# Patient Record
Sex: Male | Born: 1970 | Race: White | Hispanic: No | Marital: Married | Smoking: Never smoker
Health system: Southern US, Community
[De-identification: ages and names within clinical notes are randomized; demographics above are authoritative.]

## PROBLEM LIST (undated history)

## (undated) DIAGNOSIS — K852 Alcohol induced acute pancreatitis without necrosis or infection: Secondary | ICD-10-CM

## (undated) DIAGNOSIS — S0990XA Unspecified injury of head, initial encounter: Secondary | ICD-10-CM

## (undated) DIAGNOSIS — F1010 Alcohol abuse, uncomplicated: Secondary | ICD-10-CM

## (undated) DIAGNOSIS — I10 Essential (primary) hypertension: Secondary | ICD-10-CM

## (undated) HISTORY — PX: TRACHEOSTOMY: SUR1362

## (undated) HISTORY — DX: Unspecified injury of head, initial encounter: S09.90XA

---

## 2008-08-30 ENCOUNTER — Emergency Department (HOSPITAL_BASED_OUTPATIENT_CLINIC_OR_DEPARTMENT_OTHER): Admission: EM | Admit: 2008-08-30 | Discharge: 2008-08-30 | Payer: Self-pay | Admitting: Emergency Medicine

## 2013-01-22 ENCOUNTER — Emergency Department (HOSPITAL_BASED_OUTPATIENT_CLINIC_OR_DEPARTMENT_OTHER): Payer: Self-pay

## 2013-01-22 ENCOUNTER — Encounter (HOSPITAL_BASED_OUTPATIENT_CLINIC_OR_DEPARTMENT_OTHER): Payer: Self-pay | Admitting: *Deleted

## 2013-01-22 ENCOUNTER — Emergency Department (HOSPITAL_BASED_OUTPATIENT_CLINIC_OR_DEPARTMENT_OTHER)
Admission: EM | Admit: 2013-01-22 | Discharge: 2013-01-22 | Disposition: A | Discharge status: Home / Self Care | Payer: Self-pay | Attending: Emergency Medicine | Admitting: Emergency Medicine

## 2013-01-22 DIAGNOSIS — R0789 Other chest pain: Secondary | ICD-10-CM | POA: Insufficient documentation

## 2013-01-22 DIAGNOSIS — R062 Wheezing: Secondary | ICD-10-CM | POA: Insufficient documentation

## 2013-01-22 DIAGNOSIS — J3489 Other specified disorders of nose and nasal sinuses: Secondary | ICD-10-CM | POA: Insufficient documentation

## 2013-01-22 DIAGNOSIS — R059 Cough, unspecified: Secondary | ICD-10-CM | POA: Insufficient documentation

## 2013-01-22 DIAGNOSIS — J40 Bronchitis, not specified as acute or chronic: Secondary | ICD-10-CM

## 2013-01-22 DIAGNOSIS — R509 Fever, unspecified: Secondary | ICD-10-CM | POA: Insufficient documentation

## 2013-01-22 DIAGNOSIS — R05 Cough: Secondary | ICD-10-CM | POA: Insufficient documentation

## 2013-01-22 DIAGNOSIS — J209 Acute bronchitis, unspecified: Secondary | ICD-10-CM | POA: Insufficient documentation

## 2013-01-22 MED ORDER — AZITHROMYCIN 250 MG PO TABS
500.0000 mg | ORAL_TABLET | Freq: Once | ORAL | Status: AC
  Administered 2013-01-22: 500 mg via ORAL
  Filled 2013-01-22: qty 2

## 2013-01-22 MED ORDER — PREDNISONE 20 MG PO TABS: 60.0000 mg | ORAL_TABLET | Freq: Every day | ORAL | Status: DC

## 2013-01-22 MED ORDER — ALBUTEROL SULFATE HFA 108 (90 BASE) MCG/ACT IN AERS
2.0000 | INHALATION_SPRAY | RESPIRATORY_TRACT | Status: DC | PRN
  Administered 2013-01-22: 2 via RESPIRATORY_TRACT
  Filled 2013-01-22: qty 6.7

## 2013-01-22 MED ORDER — PREDNISONE 50 MG PO TABS
60.0000 mg | ORAL_TABLET | Freq: Once | ORAL | Status: AC
  Administered 2013-01-22: 60 mg via ORAL
  Filled 2013-01-22: qty 1

## 2013-01-22 MED ORDER — AZITHROMYCIN 250 MG PO TABS: 250.0000 mg | ORAL_TABLET | Freq: Every day | ORAL | Status: DC

## 2013-01-22 NOTE — ED Provider Notes (Signed)
History  This chart was scribed for Tyler Warren,  by Ardeen Jourdain, ED Scribe. This patient was seen in room MH06/MH06 and the patient's care was started at 2254.  CSN: [id number]  Arrival date & time 01/22/13  2107   First MD Initiated Contact with Patient 01/22/13 2254      Chief Complaint  Patient presents with  . URI     The history is provided by the patient. No language interpreter was used.    Tyler Warren is a 42 y.o. male who presents to the Emergency Department complaining of gradual onset, gradually worsening, constant cough with associated fever, congestion and chest tightness that began 4 days ago. He states he has a productive cough. He denies any nausea, emesis, diarrhea, rash, ear pain and CP as associated symptoms. He denies any previous similar symptoms. He denies any h/o asthma or other respiratory disorders.   History reviewed. No pertinent past medical history.  Past Surgical History  Procedure Laterality Date  . Tracheostomy      History reviewed. No pertinent family history.  History  Substance Use Topics  . Smoking status: Never Smoker   . Smokeless tobacco: Not on file  . Alcohol Use: No      Review of Systems  Constitutional: Negative for fever and chills.  HENT: Positive for congestion, rhinorrhea and sinus pressure. Negative for ear pain, sore throat and neck pain.   Respiratory: Positive for cough and chest tightness. Negative for shortness of breath.   Cardiovascular: Negative for chest pain.  Gastrointestinal: Negative for nausea, vomiting and diarrhea.  Neurological: Negative for weakness.  All other systems reviewed and are negative.    Allergies  Review of patient's allergies indicates no known allergies.  Home Medications  No current outpatient prescriptions on file.  Triage Vitals: BP 162/90  Pulse 103  Temp(Src) 100.4 F (38 C) (Oral)  Resp 16  Ht 5\' 9"  (1.753 m)  Wt 200 lb (90.719 kg)  BMI 29.52 kg/m2  SpO2  100%  Physical Exam  Nursing note and vitals reviewed. Constitutional: He is oriented to person, place, and time. He appears well-developed and well-nourished. No distress.  HENT:  Head: Normocephalic and atraumatic.  Eyes: EOM are normal. Pupils are equal, round, and reactive to light.  Neck: Normal range of motion. Neck supple. No tracheal deviation present.  Cardiovascular: Normal rate, regular rhythm and normal heart sounds.   Pulmonary/Chest: Effort normal. No respiratory distress. He has wheezes. He has no rales. He exhibits no tenderness.  Inspiratory and expiratory wheezes bilaterally with diminished breath sounds  Abdominal: Soft. Bowel sounds are normal. He exhibits no distension and no mass. There is no tenderness. There is no rebound and no guarding.  Musculoskeletal: Normal range of motion. He exhibits no edema.  Neurological: He is alert and oriented to person, place, and time.  Skin: Skin is warm and dry. He is not diaphoretic.  Psychiatric: He has a normal mood and affect. His behavior is normal.    ED Course  Procedures (including critical care time)  DIAGNOSTIC STUDIES: Oxygen Saturation is 100% on room air, normal by my interpretation.    COORDINATION OF CARE:  10:59 PM-Discussed treatment plan which includes CXR and cough medication with pt at bedside and pt agreed to plan.    Labs Reviewed - No data to display Dg Chest 2 View  01/22/2013  *RADIOLOGY REPORT*  Clinical Data: Cough and congestion.  CHEST - 2 VIEW  Comparison: None available.  Findings:  The heart size is normal.  Mild interstitial coarsening is likely chronic.  No significant airspace consolidation is evident.  Degenerative or post-traumatic changes are again noted at the right [facility] joint and coracoclavicular ligament.  The visualized soft tissues and bony thorax are otherwise unremarkable.  IMPRESSION:  1.  No acute cardiopulmonary disease. 2.  Low lung volumes and mild chronic interstitial coarsening.  3.  Post-traumatic changes of the distal right clavicle.   Original Report Authenticated By: Marin Roberts, M.D.      Diagnosis.: Bronchitis    MDM  Patient presents with several days of cough and fever. He has respiratory and expiratory wheezing but normal oxygenation. Chest x-ray shows no evidence of pneumonia. Patient will be treated empirically with Zithromax, prednisone and bronchodilators.  I personally performed the services described in this documentation, which was scribed in my presence. The recorded information has been reviewed and is accurate.     Tyler Crease, MD 01/22/13 [phone]

## 2013-01-22 NOTE — Patient Instructions (Signed)
Instructed pt on the proper use of administering albuteral mdi via aerochamber pt tolerated well

## 2013-01-22 NOTE — ED Notes (Signed)
Pt c/o URi symptoms x 4 days 

## 2014-06-17 ENCOUNTER — Ambulatory Visit (INDEPENDENT_AMBULATORY_CARE_PROVIDER_SITE_OTHER): Payer: Managed Care, Other (non HMO) | Admitting: Family Medicine

## 2014-06-17 ENCOUNTER — Encounter: Payer: Self-pay | Admitting: Family Medicine

## 2014-06-17 VITALS — BP 153/96 | HR 73 | Ht 69.0 in | Wt 202.0 lb

## 2014-06-17 DIAGNOSIS — R209 Unspecified disturbances of skin sensation: Secondary | ICD-10-CM

## 2014-06-17 DIAGNOSIS — I10 Essential (primary) hypertension: Secondary | ICD-10-CM

## 2014-06-17 DIAGNOSIS — Z87820 Personal history of traumatic brain injury: Secondary | ICD-10-CM | POA: Insufficient documentation

## 2014-06-17 DIAGNOSIS — R200 Anesthesia of skin: Secondary | ICD-10-CM | POA: Insufficient documentation

## 2014-06-17 MED ORDER — LISINOPRIL 20 MG PO TABS: ORAL_TABLET | ORAL | Status: DC
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
## 2014-06-17 NOTE — Patient Instructions (Signed)
Blood pressure log: Goal less than 140/90

## 2014-06-18 ENCOUNTER — Telehealth: Payer: Self-pay

## 2014-06-18 NOTE — Telephone Encounter (Signed)
Patient wanted his pharmacy changed to [facility]. Done.

## 2014-07-05 ENCOUNTER — Encounter: Payer: Self-pay | Admitting: Family Medicine

## 2014-07-05 ENCOUNTER — Ambulatory Visit (INDEPENDENT_AMBULATORY_CARE_PROVIDER_SITE_OTHER): Payer: Managed Care, Other (non HMO) | Admitting: Family Medicine

## 2014-07-05 VITALS — BP 126/79 | HR 60 | Temp 98.2°F | Wt 197.0 lb

## 2014-07-05 DIAGNOSIS — J329 Chronic sinusitis, unspecified: Secondary | ICD-10-CM

## 2014-07-05 DIAGNOSIS — I10 Essential (primary) hypertension: Secondary | ICD-10-CM

## 2014-07-05 DIAGNOSIS — A499 Bacterial infection, unspecified: Secondary | ICD-10-CM

## 2014-07-05 DIAGNOSIS — K852 Alcohol induced acute pancreatitis without necrosis or infection: Secondary | ICD-10-CM

## 2014-07-05 DIAGNOSIS — K859 Acute pancreatitis without necrosis or infection, unspecified: Secondary | ICD-10-CM | POA: Insufficient documentation

## 2014-07-05 DIAGNOSIS — B9689 Other specified bacterial agents as the cause of diseases classified elsewhere: Secondary | ICD-10-CM

## 2014-07-05 MED ORDER — LISINOPRIL 20 MG PO TABS: ORAL_TABLET | ORAL | Status: DC

## 2014-07-05 MED ORDER — AMOXICILLIN-POT CLAVULANATE 500-125 MG PO TABS: ORAL_TABLET | ORAL | Status: AC
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
## 2014-12-16 ENCOUNTER — Ambulatory Visit (INDEPENDENT_AMBULATORY_CARE_PROVIDER_SITE_OTHER): Payer: Managed Care, Other (non HMO) | Admitting: Physician Assistant

## 2014-12-16 ENCOUNTER — Encounter: Payer: Self-pay | Admitting: Physician Assistant

## 2014-12-16 VITALS — BP 135/79 | HR 75 | Ht 69.0 in | Wt 203.0 lb

## 2014-12-16 DIAGNOSIS — M545 Low back pain, unspecified: Secondary | ICD-10-CM

## 2014-12-16 MED ORDER — HYDROCODONE-ACETAMINOPHEN 7.5-325 MG PO TABS: 1.0000 | ORAL_TABLET | Freq: Three times a day (TID) | ORAL | Status: DC | PRN

## 2014-12-16 MED ORDER — MELOXICAM 15 MG PO TABS: 15.0000 mg | ORAL_TABLET | Freq: Every day | ORAL | Status: DC

## 2014-12-16 MED ORDER — KETOROLAC TROMETHAMINE 60 MG/2ML IM SOLN
60.0000 mg | Freq: Once | INTRAMUSCULAR | Status: AC
  Administered 2014-12-16: 60 mg via INTRAMUSCULAR

## 2014-12-16 MED ORDER — CYCLOBENZAPRINE HCL 10 MG PO TABS: 10.0000 mg | ORAL_TABLET | Freq: Three times a day (TID) | ORAL | Status: DC | PRN
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
## 2014-12-16 NOTE — Patient Instructions (Signed)
mobic with food for next 2 weeks.  Heat  norco as needed for acute pain.  Flexeril as muscle relaxer(may make sleepy).  Low Back Sprain with Rehab  A sprain is an injury in which a ligament is torn. The ligaments of the lower back are vulnerable to sprains. However, they are strong and require great force to be injured. These ligaments are important for stabilizing the spinal column. Sprains are classified into three categories. Grade 1 sprains cause pain, but the tendon is not lengthened. Grade 2 sprains include a lengthened ligament, due to the ligament being stretched or partially ruptured. With grade 2 sprains there is still function, although the function may be decreased. Grade 3 sprains involve a complete tear of the tendon or muscle, and function is usually impaired. SYMPTOMS   Severe pain in the lower back.  Sometimes, a feeling of a "pop," "snap," or tear, at the time of injury.  Tenderness and sometimes swelling at the injury site.  Uncommonly, bruising (contusion) within 48 hours of injury.  Muscle spasms in the back. CAUSES  Low back sprains occur when a force is placed on the ligaments that is greater than they can handle. Common causes of injury include:  Performing a stressful act while off-balance.  Repetitive stressful activities that involve movement of the lower back.  Direct hit (trauma) to the lower back. RISK INCREASES WITH:  Contact sports (football, wrestling).  Collisions (major skiing accidents).  Sports that require throwing or lifting (baseball, weightlifting).  Sports involving twisting of the spine (gymnastics, diving, tennis, golf).  Poor strength and flexibility.  Inadequate protection.  Previous back injury or surgery (especially fusion). PREVENTION  Wear properly fitted and padded protective equipment.  Warm up and stretch properly before activity.  Allow for adequate recovery between workouts.  Maintain physical  fitness:  Strength, flexibility, and endurance.  Cardiovascular fitness.  Maintain a healthy body weight. PROGNOSIS  If treated properly, low back sprains usually heal with non-surgical treatment. The length of time for healing depends on the severity of the injury.  RELATED COMPLICATIONS   Recurring symptoms, resulting in a chronic problem.  Chronic inflammation and pain in the low back.  Delayed healing or resolution of symptoms, especially if activity is resumed too soon.  Prolonged impairment.  Unstable or arthritic joints of the low back. TREATMENT  Treatment first involves the use of ice and medicine, to reduce pain and inflammation. The use of strengthening and stretching exercises may help reduce pain with activity. These exercises may be performed at home or with a therapist. Severe injuries may require referral to a therapist for further evaluation and treatment, such as ultrasound. Your caregiver may advise that you wear a back brace or corset, to help reduce pain and discomfort. Often, prolonged bed rest results in greater harm then benefit. Corticosteroid injections may be recommended. However, these should be reserved for the most serious cases. It is important to avoid using your back when lifting objects. At night, sleep on your back on a firm mattress, with a pillow placed under your knees. If non-surgical treatment is unsuccessful, surgery may be needed.  MEDICATION   If pain medicine is needed, nonsteroidal anti-inflammatory medicines (aspirin and ibuprofen), or other minor pain relievers (acetaminophen), are often advised.  Do not take pain medicine for 7 days before surgery.  Prescription pain relievers may be given, if your caregiver thinks they are needed. Use only as directed and only as much as you need.  Ointments applied to  the skin may be helpful.  Corticosteroid injections may be given by your caregiver. These injections should be reserved for the most  serious cases, because they may only be given a certain number of times. HEAT AND COLD  Cold treatment (icing) should be applied for 10 to 15 minutes every 2 to 3 hours for inflammation and pain, and immediately after activity that aggravates your symptoms. Use ice packs or an ice massage.  Heat treatment may be used before performing stretching and strengthening activities prescribed by your caregiver, physical therapist, or athletic trainer. Use a heat pack or a warm water soak. SEEK MEDICAL CARE IF:   Symptoms get worse or do not improve in 2 to 4 weeks, despite treatment.  You develop numbness or weakness in either leg.  You lose bowel or bladder function.  Any of the following occur after surgery: fever, increased pain, swelling, redness, drainage of fluids, or bleeding in the affected area.  New, unexplained symptoms develop. (Drugs used in treatment may produce side effects.) EXERCISES  RANGE OF MOTION (ROM) AND STRETCHING EXERCISES - Low Back Sprain Most people with lower back pain will find that their symptoms get worse with excessive bending forward (flexion) or arching at the lower back (extension). The exercises that will help resolve your symptoms will focus on the opposite motion.  Your physician, physical therapist or athletic trainer will help you determine which exercises will be most helpful to resolve your lower back pain. Do not complete any exercises without first consulting with your caregiver. Discontinue any exercises which make your symptoms worse, until you speak to your caregiver. If you have pain, numbness or tingling which travels down into your buttocks, leg or foot, the goal of the therapy is for these symptoms to move closer to your back and eventually resolve. Sometimes, these leg symptoms will get better, but your lower back pain may worsen. This is often an indication of progress in your rehabilitation. Be very alert to any changes in your symptoms and the  activities in which you participated in the 24 hours prior to the change. Sharing this information with your caregiver will allow him or her to most efficiently treat your condition. These exercises may help you when beginning to rehabilitate your injury. Your symptoms may resolve with or without further involvement from your physician, physical therapist or athletic trainer. While completing these exercises, remember:   Restoring tissue flexibility helps normal motion to return to the joints. This allows healthier, less painful movement and activity.  An effective stretch should be held for at least 30 seconds.  A stretch should never be painful. You should only feel a gentle lengthening or release in the stretched tissue. FLEXION RANGE OF MOTION AND STRETCHING EXERCISES: STRETCH - Flexion, Single Knee to Chest   Lie on a firm bed or floor with both legs extended in front of you.  Keeping one leg in contact with the floor, bring your opposite knee to your chest. Hold your leg in place by either grabbing behind your thigh or at your knee.  Pull until you feel a gentle stretch in your low back. Hold __________ seconds.  Slowly release your grasp and repeat the exercise with the opposite side. Repeat __________ times. Complete this exercise __________ times per day.  STRETCH - Flexion, Double Knee to Chest  Lie on a firm bed or floor with both legs extended in front of you.  Keeping one leg in contact with the floor, bring your opposite knee  to your chest.  Tense your stomach muscles to support your back and then lift your other knee to your chest. Hold your legs in place by either grabbing behind your thighs or at your knees.  Pull both knees toward your chest until you feel a gentle stretch in your low back. Hold __________ seconds.  Tense your stomach muscles and slowly return one leg at a time to the floor. Repeat __________ times. Complete this exercise __________ times per day.   STRETCH - Low Trunk Rotation  Lie on a firm bed or floor. Keeping your legs in front of you, bend your knees so they are both pointed toward the ceiling and your feet are flat on the floor.  Extend your arms out to the side. This will stabilize your upper body by keeping your shoulders in contact with the floor.  Gently and slowly drop both knees together to one side until you feel a gentle stretch in your low back. Hold for __________ seconds.  Tense your stomach muscles to support your lower back as you bring your knees back to the starting position. Repeat the exercise to the other side. Repeat __________ times. Complete this exercise __________ times per day  EXTENSION RANGE OF MOTION AND FLEXIBILITY EXERCISES: STRETCH - Extension, Prone on Elbows   Lie on your stomach on the floor, a bed will be too soft. Place your palms about shoulder width apart and at the height of your head.  Place your elbows under your shoulders. If this is too painful, stack pillows under your chest.  Allow your body to relax so that your hips drop lower and make contact more completely with the floor.  Hold this position for __________ seconds.  Slowly return to lying flat on the floor. Repeat __________ times. Complete this exercise __________ times per day.  RANGE OF MOTION - Extension, Prone Press Ups  Lie on your stomach on the floor, a bed will be too soft. Place your palms about shoulder width apart and at the height of your head.  Keeping your back as relaxed as possible, slowly straighten your elbows while keeping your hips on the floor. You may adjust the placement of your hands to maximize your comfort. As you gain motion, your hands will come more underneath your shoulders.  Hold this position __________ seconds.  Slowly return to lying flat on the floor. Repeat __________ times. Complete this exercise __________ times per day.  RANGE OF MOTION- Quadruped, Neutral Spine   Assume a hands  and knees position on a firm surface. Keep your hands under your shoulders and your knees under your hips. You may place padding under your knees for comfort.  Drop your head and point your tailbone toward the ground below you. This will round out your lower back like an angry cat. Hold this position for __________ seconds.  Slowly lift your head and release your tail bone so that your back sags into a large arch, like an old horse.  Hold this position for __________ seconds.  Repeat this until you feel limber in your low back.  Now, find your "sweet spot." This will be the most comfortable position somewhere between the two previous positions. This is your neutral spine. Once you have found this position, tense your stomach muscles to support your low back.  Hold this position for __________ seconds. Repeat __________ times. Complete this exercise __________ times per day.  STRENGTHENING EXERCISES - Low Back Sprain These exercises may help you when  beginning to rehabilitate your injury. These exercises should be done near your "sweet spot." This is the neutral, low-back arch, somewhere between fully rounded and fully arched, that is your least painful position. When performed in this safe range of motion, these exercises can be used for people who have either a flexion or extension based injury. These exercises may resolve your symptoms with or without further involvement from your physician, physical therapist or athletic trainer. While completing these exercises, remember:   Muscles can gain both the endurance and the strength needed for everyday activities through controlled exercises.  Complete these exercises as instructed by your physician, physical therapist or athletic trainer. Increase the resistance and repetitions only as guided.  You may experience muscle soreness or fatigue, but the pain or discomfort you are trying to eliminate should never worsen during these exercises. If this  pain does worsen, stop and make certain you are following the directions exactly. If the pain is still present after adjustments, discontinue the exercise until you can discuss the trouble with your caregiver. STRENGTHENING - Deep Abdominals, Pelvic Tilt   Lie on a firm bed or floor. Keeping your legs in front of you, bend your knees so they are both pointed toward the ceiling and your feet are flat on the floor.  Tense your lower abdominal muscles to press your low back into the floor. This motion will rotate your pelvis so that your tail bone is scooping upwards rather than pointing at your feet or into the floor. With a gentle tension and even breathing, hold this position for __________ seconds. Repeat __________ times. Complete this exercise __________ times per day.  STRENGTHENING - Abdominals, Crunches   Lie on a firm bed or floor. Keeping your legs in front of you, bend your knees so they are both pointed toward the ceiling and your feet are flat on the floor. Cross your arms over your chest.  Slightly tip your chin down without bending your neck.  Tense your abdominals and slowly lift your trunk high enough to just clear your shoulder blades. Lifting higher can put excessive stress on the lower back and does not further strengthen your abdominal muscles.  Control your return to the starting position. Repeat __________ times. Complete this exercise __________ times per day.  STRENGTHENING - Quadruped, Opposite UE/LE Lift   Assume a hands and knees position on a firm surface. Keep your hands under your shoulders and your knees under your hips. You may place padding under your knees for comfort.  Find your neutral spine and gently tense your abdominal muscles so that you can maintain this position. Your shoulders and hips should form a rectangle that is parallel with the floor and is not twisted.  Keeping your trunk steady, lift your right hand no higher than your shoulder and then your  left leg no higher than your hip. Make sure you are not holding your breath. Hold this position for __________ seconds.  Continuing to keep your abdominal muscles tense and your back steady, slowly return to your starting position. Repeat with the opposite arm and leg. Repeat __________ times. Complete this exercise __________ times per day.  STRENGTHENING - Abdominals and Quadriceps, Straight Leg Raise   Lie on a firm bed or floor with both legs extended in front of you.  Keeping one leg in contact with the floor, bend the other knee so that your foot can rest flat on the floor.  Find your neutral spine, and tense your abdominal  muscles to maintain your spinal position throughout the exercise.  Slowly lift your straight leg off the floor about 6 inches for a count of 15, making sure to not hold your breath.  Still keeping your neutral spine, slowly lower your leg all the way to the floor. Repeat this exercise with each leg __________ times. Complete this exercise __________ times per day. POSTURE AND BODY MECHANICS CONSIDERATIONS - Low Back Sprain Keeping correct posture when sitting, standing or completing your activities will reduce the stress put on different body tissues, allowing injured tissues a chance to heal and limiting painful experiences. The following are general guidelines for improved posture. Your physician or physical therapist will provide you with any instructions specific to your needs. While reading these guidelines, remember:  The exercises prescribed by your provider will help you have the flexibility and strength to maintain correct postures.  The correct posture provides the best environment for your joints to work. All of your joints have less wear and tear when properly supported by a spine with good posture. This means you will experience a healthier, less painful body.  Correct posture must be practiced with all of your activities, especially prolonged sitting and  standing. Correct posture is as important when doing repetitive low-stress activities (typing) as it is when doing a single heavy-load activity (lifting). RESTING POSITIONS Consider which positions are most painful for you when choosing a resting position. If you have pain with flexion-based activities (sitting, bending, stooping, squatting), choose a position that allows you to rest in a less flexed posture. You would want to avoid curling into a fetal position on your side. If your pain worsens with extension-based activities (prolonged standing, working overhead), avoid resting in an extended position such as sleeping on your stomach. Most people will find more comfort when they rest with their spine in a more neutral position, neither too rounded nor too arched. Lying on a non-sagging bed on your side with a pillow between your knees, or on your back with a pillow under your knees will often provide some relief. Keep in mind, being in any one position for a prolonged period of time, no matter how correct your posture, can still lead to stiffness. PROPER SITTING POSTURE In order to minimize stress and discomfort on your spine, you must sit with correct posture. Sitting with good posture should be effortless for a healthy body. Returning to good posture is a gradual process. Many people can work toward this most comfortably by using various supports until they have the flexibility and strength to maintain this posture on their own. When sitting with proper posture, your ears will fall over your shoulders and your shoulders will fall over your hips. You should use the back of the chair to support your upper back. Your lower back will be in a neutral position, just slightly arched. You may place a small pillow or folded towel at the base of your lower back for  support.  When working at a desk, create an environment that supports good, upright posture. Without extra support, muscles tire, which leads to  excessive strain on joints and other tissues. Keep these recommendations in mind: CHAIR:  A chair should be able to slide under your desk when your back makes contact with the back of the chair. This allows you to work closely.  The chair's height should allow your eyes to be level with the upper part of your monitor and your hands to be slightly lower than your  elbows. BODY POSITION  Your feet should make contact with the floor. If this is not possible, use a foot rest.  Keep your ears over your shoulders. This will reduce stress on your neck and low back. INCORRECT SITTING POSTURES  If you are feeling tired and unable to assume a healthy sitting posture, do not slouch or slump. This puts excessive strain on your back tissues, causing more damage and pain. Healthier options include:  Using more support, like a lumbar pillow.  Switching tasks to something that requires you to be upright or walking.  Talking a brief walk.  Lying down to rest in a neutral-spine position. PROLONGED STANDING WHILE SLIGHTLY LEANING FORWARD  When completing a task that requires you to lean forward while standing in one place for a long time, place either foot up on a stationary 2-4 inch high object to help maintain the best posture. When both feet are on the ground, the lower back tends to lose its slight inward curve. If this curve flattens (or becomes too large), then the back and your other joints will experience too much stress, tire more quickly, and can cause pain. CORRECT STANDING POSTURES Proper standing posture should be assumed with all daily activities, even if they only take a few moments, like when brushing your teeth. As in sitting, your ears should fall over your shoulders and your shoulders should fall over your hips. You should keep a slight tension in your abdominal muscles to brace your spine. Your tailbone should point down to the ground, not behind your body, resulting in an over-extended  swayback posture.  INCORRECT STANDING POSTURES  Common incorrect standing postures include a forward head, locked knees and/or an excessive swayback. WALKING Walk with an upright posture. Your ears, shoulders and hips should all line-up. PROLONGED ACTIVITY IN A FLEXED POSITION When completing a task that requires you to bend forward at your waist or lean over a low surface, try to find a way to stabilize 3 out of 4 of your limbs. You can place a hand or elbow on your thigh or rest a knee on the surface you are reaching across. This will provide you more stability, so that your muscles do not tire as quickly. By keeping your knees relaxed, or slightly bent, you will also reduce stress across your lower back. CORRECT LIFTING TECHNIQUES DO :  Assume a wide stance. This will provide you more stability and the opportunity to get as close as possible to the object which you are lifting.  Tense your abdominals to brace your spine. Bend at the knees and hips. Keeping your back locked in a neutral-spine position, lift using your leg muscles. Lift with your legs, keeping your back straight.  Test the weight of unknown objects before attempting to lift them.  Try to keep your elbows locked down at your sides in order get the best strength from your shoulders when carrying an object.  Always ask for help when lifting heavy or awkward objects. INCORRECT LIFTING TECHNIQUES DO NOT:   Lock your knees when lifting, even if it is a small object.  Bend and twist. Pivot at your feet or move your feet when needing to change directions.  Assume that you can safely pick up even a paperclip without proper posture. Document Released: 10/04/2005 Document Revised: 12/27/2011 Document Reviewed: 01/16/2009 [facility] Patient Information 2015 [place], [place]. This information is not intended to replace advice given to you by your health care provider. Make sure you discuss any  questions you have with your health care  provider.

## 2015-01-11 ENCOUNTER — Emergency Department
Admission: EM | Admit: 2015-01-11 | Discharge: 2015-01-11 | Disposition: A | Discharge status: Home / Self Care | Payer: Managed Care, Other (non HMO) | Source: Home / Self Care | Attending: Family Medicine | Admitting: Family Medicine

## 2015-01-11 ENCOUNTER — Encounter: Payer: Self-pay | Admitting: *Deleted

## 2015-01-11 DIAGNOSIS — S61412A Laceration without foreign body of left hand, initial encounter: Secondary | ICD-10-CM | POA: Diagnosis not present

## 2015-01-11 NOTE — ED Notes (Signed)
Pt cut his L hand on a lawn mower at 1130am.  Pain 10/10 when moving 0 at rest.  Last tetanus was last year.

## 2015-01-11 NOTE — ED Provider Notes (Signed)
CSN: [id number]     Arrival date & time 01/11/15  1135 History   First MD Initiated Contact with Patient 01/11/15 1144     Chief Complaint  Patient presents with  . Laceration    L hand      HPI Comments: Patient cut his left hand on a sharp metal edge 30 minutes ago.  He had a Tdap last year.  Patient is a 44 y.o. male presenting with skin laceration. The history is provided by the patient.  Laceration Location:  Hand Hand laceration location:  L hand Length (cm):  1 Depth:  Through dermis Quality: straight   Bleeding: controlled   Time since incident:  30 minutes Laceration mechanism:  Metal edge Pain details:    Quality:  Aching   Severity:  No pain Foreign body present:  No foreign bodies Relieved by:  Pressure Worsened by:  Movement Ineffective treatments:  Pressure Tetanus status:  Up to date   Past Medical History  Diagnosis Date  . Head trauma    Past Surgical History  Procedure Laterality Date  . Tracheostomy     History reviewed. No pertinent family history. History  Substance Use Topics  . Smoking status: Never Smoker   . Smokeless tobacco: Not on file  . Alcohol Use: 3.5 oz/week    7 drink(s) per week    Review of Systems  All other systems reviewed and are negative.   Allergies  Review of patient's allergies indicates no known allergies.  Home Medications   Prior to Admission medications   Medication Sig Start Date End Date Taking? Authorizing Provider  cyclobenzaprine (FLEXERIL) 10 MG tablet Take 1 tablet (10 mg total) by mouth 3 (three) times daily as needed for muscle spasms. 12/16/14   Jade L Breeback, PA-C  HYDROcodone-acetaminophen (NORCO) 7.5-325 MG per tablet Take 1 tablet by mouth every 8 (eight) hours as needed for moderate pain (cough). 12/16/14   Jade L Breeback, PA-C  lisinopril (PRINIVIL,ZESTRIL) 20 MG tablet One tablet by mouth daily for blood pressure control. 07/05/14 07/05/15  Laren BoomSean Hommel, DO  meloxicam (MOBIC) 15 MG tablet Take  1 tablet (15 mg total) by mouth daily. 12/16/14   Jade L Breeback, PA-C   BP 145/91 mmHg  Pulse 99  Temp(Src) 98 F (36.7 C) (Oral)  Ht 5\' 9"  (1.753 m)  Wt 200 lb 8 oz (90.946 kg)  BMI 29.60 kg/m2  SpO2 96% Physical Exam  Constitutional: He is oriented to person, place, and time. He appears well-developed and well-nourished. No distress.  HENT:  Head: Atraumatic.  Eyes: Pupils are equal, round, and reactive to light.  Musculoskeletal:       Right shoulder: He exhibits laceration. He exhibits normal range of motion, no tenderness, normal pulse and normal strength.       Hands: Palmar surface of left hand has a superficial 1cm long linear laceration at base of third finger as noted on diagram.  Third finger has full range of motion.  Distal neurovascular function is intact.     Neurological: He is alert and oriented to person, place, and time.  Skin: Skin is warm and dry.  Nursing note and vitals reviewed.   ED Course  Procedures Laceration Repair (Dermabond) Discussed benefits and risks of procedure and verbal consent obtained. Using sterile technique, cleansed wound with Betadine followed by copious lavage with normal saline.  Wound carefully inspected for debris and foreign bodies; none found.  Wound edges carefully approximated in normal anatomic position and  closed with Dermabond.  Wound precautions explained to patient.     MDM   1. Laceration of left hand, initial encounter     Keep wound clean and dry.  Minimize use of left hand until laceration healed.  Return for any signs of infection: redness, pain, swelling, drainage, etc.  Follow instructions on Dermabond instruction sheet.    Lattie Haw, MD 01/17/15 [phone]

## 2015-01-11 NOTE — Discharge Instructions (Signed)
Keep wound clean and dry.  Minimize use of left hand until laceration healed.  Return for any signs of infection: redness, pain, swelling, drainage, etc.  Follow instructions on Dermabond instruction sheet.

## 2015-01-21 ENCOUNTER — Emergency Department (HOSPITAL_BASED_OUTPATIENT_CLINIC_OR_DEPARTMENT_OTHER)
Admission: EM | Admit: 2015-01-21 | Discharge: 2015-01-21 | Disposition: A | Discharge status: Home / Self Care | Payer: Managed Care, Other (non HMO) | Attending: Emergency Medicine | Admitting: Emergency Medicine

## 2015-01-21 ENCOUNTER — Encounter (HOSPITAL_BASED_OUTPATIENT_CLINIC_OR_DEPARTMENT_OTHER): Payer: Self-pay | Admitting: *Deleted

## 2015-01-21 DIAGNOSIS — Z87828 Personal history of other (healed) physical injury and trauma: Secondary | ICD-10-CM | POA: Diagnosis not present

## 2015-01-21 DIAGNOSIS — M5432 Sciatica, left side: Secondary | ICD-10-CM | POA: Diagnosis not present

## 2015-01-21 DIAGNOSIS — I10 Essential (primary) hypertension: Secondary | ICD-10-CM | POA: Diagnosis not present

## 2015-01-21 DIAGNOSIS — M545 Low back pain: Secondary | ICD-10-CM | POA: Diagnosis present

## 2015-01-21 DIAGNOSIS — Z791 Long term (current) use of non-steroidal anti-inflammatories (NSAID): Secondary | ICD-10-CM | POA: Diagnosis not present

## 2015-01-21 DIAGNOSIS — Z79899 Other long term (current) drug therapy: Secondary | ICD-10-CM | POA: Diagnosis not present

## 2015-01-21 HISTORY — DX: Essential (primary) hypertension: I10

## 2015-01-21 MED ORDER — PREDNISONE 20 MG PO TABS: 60.0000 mg | ORAL_TABLET | Freq: Every day | ORAL | Status: DC

## 2015-01-21 MED ORDER — HYDROCODONE-ACETAMINOPHEN 7.5-325 MG PO TABS: 1.0000 | ORAL_TABLET | Freq: Three times a day (TID) | ORAL | Status: DC | PRN

## 2015-01-21 NOTE — ED Provider Notes (Signed)
CSN: [id number]     Arrival date & time 01/21/15  [id number] History   First MD Initiated Contact with Patient 01/21/15 [phone]     Chief Complaint  Patient presents with  . Back Pain    Patient is a 44 y.o. male presenting with back pain. The history is provided by the patient.  Back Pain Location:  Lumbar spine Quality:  Aching and shooting Radiates to: down the left leg. Pain severity:  Moderate Duration:  1 month Timing:  Intermittent (It had improved after a flare 1 month ago but then started up again this weekend while working) Chronicity:  Recurrent Relieved by:  Being still Exacerbated by: movement, bending. Associated symptoms: tingling   Associated symptoms: no abdominal pain, no chest pain, no dysuria, no fever and no perianal numbness     Past Medical History  Diagnosis Date  . Head trauma   . Hypertension    Past Surgical History  Procedure Laterality Date  . Tracheostomy     No family history on file. History  Substance Use Topics  . Smoking status: Never Smoker   . Smokeless tobacco: Not on file  . Alcohol Use: 3.5 oz/week    7 Standard drinks or equivalent per week    Review of Systems  Constitutional: Negative for fever.  Cardiovascular: Negative for chest pain.  Gastrointestinal: Negative for abdominal pain.  Genitourinary: Negative for dysuria.  Musculoskeletal: Positive for back pain.  Neurological: Positive for tingling.  All other systems reviewed and are negative.     Allergies  Review of patient's allergies indicates no known allergies.  Home Medications   Prior to Admission medications   Medication Sig Start Date End Date Taking? Authorizing Provider  lisinopril (PRINIVIL,ZESTRIL) 20 MG tablet One tablet by mouth daily for blood pressure control. 07/05/14 07/05/15 Yes Sean Hommel, DO  cyclobenzaprine (FLEXERIL) 10 MG tablet Take 1 tablet (10 mg total) by mouth 3 (three) times daily as needed for muscle spasms. 12/16/14   Jade L Breeback, PA-C   HYDROcodone-acetaminophen (NORCO) 7.5-325 MG per tablet Take 1 tablet by mouth every 8 (eight) hours as needed for moderate pain (cough). 01/21/15   Linwood DibblesJon Vencil Basnett, MD  meloxicam (MOBIC) 15 MG tablet Take 1 tablet (15 mg total) by mouth daily. 12/16/14   Jade L Breeback, PA-C  predniSONE (DELTASONE) 20 MG tablet Take 3 tablets (60 mg total) by mouth daily with breakfast. 01/21/15   Linwood DibblesJon Jancarlo Biermann, MD   BP 134/93 mmHg  Pulse 72  Temp(Src) 98.2 F (36.8 C) (Oral)  Resp 16  Ht 5\' 9"  (1.753 m)  Wt 200 lb (90.719 kg)  BMI 29.52 kg/m2  SpO2 97% Physical Exam  Constitutional: He appears well-developed and well-nourished.  HENT:  Head: Normocephalic and atraumatic.  Right Ear: External ear normal.  Left Ear: External ear normal.  Nose: Nose normal.  Eyes: Conjunctivae and EOM are normal.  Neck: Neck supple. No tracheal deviation present.  Pulmonary/Chest: Effort normal. No stridor. No respiratory distress.  Musculoskeletal: He exhibits no edema or tenderness.       Lumbar back: He exhibits decreased range of motion, pain and spasm. He exhibits no swelling and no edema.  Neurological: He is alert. He is not disoriented. No cranial nerve deficit or sensory deficit. He exhibits normal muscle tone. Coordination normal.  Reflex Scores:      Patellar reflexes are 2+ on the right side and 2+ on the left side.      Achilles reflexes are 2+ on the  right side and 2+ on the left side. Skin: Skin is warm and dry. No rash noted. He is not diaphoretic. No erythema.  Psychiatric: He has a normal mood and affect. His behavior is normal. Thought content normal.  Nursing note and vitals reviewed.   ED Course  Procedures (including critical care time) Labs Review Labs Reviewed - No data to display  Imaging Review No results found.    MDM   Final diagnoses:  Sciatica, left    No sign of acute neurological or vascular emergency associated with pt's back pain.  May have a component of sciatica.  Safe for  outpatient follow up.  Discussed treatment and outpatient follow up plans.  Dc with rx for steroids and pain meds.  Pt can take his nsaids on a full stomach.     Linwood Dibbles, MD 01/21/15 [phone]

## 2015-01-21 NOTE — Discharge Instructions (Signed)
Sciatica Sciatica is pain, weakness, numbness, or tingling along the path of the sciatic nerve. The nerve starts in the lower back and runs down the back of each leg. The nerve controls the muscles in the lower leg and in the back of the knee, while also providing sensation to the back of the thigh, lower leg, and the sole of your foot. Sciatica is a symptom of another medical condition. For instance, nerve damage or certain conditions, such as a herniated disk or bone spur on the spine, pinch or put pressure on the sciatic nerve. This causes the pain, weakness, or other sensations normally associated with sciatica. Generally, sciatica only affects one side of the body. CAUSES   Herniated or slipped disc.  Degenerative disk disease.  A pain disorder involving the narrow muscle in the buttocks (piriformis syndrome).  Pelvic injury or fracture.  Pregnancy.  Tumor (rare). SYMPTOMS  Symptoms can vary from mild to very severe. The symptoms usually travel from the low back to the buttocks and down the back of the leg. Symptoms can include:  Mild tingling or dull aches in the lower back, leg, or hip.  Numbness in the back of the calf or sole of the foot.  Burning sensations in the lower back, leg, or hip.  Sharp pains in the lower back, leg, or hip.  Leg weakness.  Severe back pain inhibiting movement. These symptoms may get worse with coughing, sneezing, laughing, or prolonged sitting or standing. Also, being overweight may worsen symptoms. DIAGNOSIS  Your caregiver will perform a physical exam to look for common symptoms of sciatica. He or she may ask you to do certain movements or activities that would trigger sciatic nerve pain. Other tests may be performed to find the cause of the sciatica. These may include:  Blood tests.  X-rays.  Imaging tests, such as an MRI or CT scan. TREATMENT  Treatment is directed at the cause of the sciatic pain. Sometimes, treatment is not necessary  and the pain and discomfort goes away on its own. If treatment is needed, your caregiver may suggest:  Over-the-counter medicines to relieve pain.  Prescription medicines, such as anti-inflammatory medicine, muscle relaxants, or narcotics.  Applying heat or ice to the painful area.  Steroid injections to lessen pain, irritation, and inflammation around the nerve.  Reducing activity during periods of pain.  Exercising and stretching to strengthen your abdomen and improve flexibility of your spine. Your caregiver may suggest losing weight if the extra weight makes the back pain worse.  Physical therapy.  Surgery to eliminate what is pressing or pinching the nerve, such as a bone spur or part of a herniated disk. HOME CARE INSTRUCTIONS   Only take over-the-counter or prescription medicines for pain or discomfort as directed by your caregiver.  Apply ice to the affected area for 20 minutes, 3-4 times a day for the first 48-72 hours. Then try heat in the same way.  Exercise, stretch, or perform your usual activities if these do not aggravate your pain.  Attend physical therapy sessions as directed by your caregiver.  Keep all follow-up appointments as directed by your caregiver.  Do not wear high heels or shoes that do not provide proper support.  Check your mattress to see if it is too soft. A firm mattress may lessen your pain and discomfort. SEEK IMMEDIATE MEDICAL CARE IF:   You lose control of your bowel or bladder (incontinence).  You have increasing weakness in the lower back, pelvis, buttocks,   or legs.  You have redness or swelling of your back.  You have a burning sensation when you urinate.  You have pain that gets worse when you lie down or awakens you at night.  Your pain is worse than you have experienced in the past.  Your pain is lasting longer than 4 weeks.  You are suddenly losing weight without reason. MAKE SURE YOU:  Understand these  instructions.  Will watch your condition.  Will get help right away if you are not doing well or get worse. Document Released: 09/28/2001 Document Revised: 04/04/2012 Document Reviewed: 02/13/2012 [employer] Patient Information 2015 [employer]. This information is not intended to replace advice given to you by your health care provider. Make sure you discuss any questions you have with your health care provider.  

## 2015-01-21 NOTE — ED Notes (Addendum)
C/o lower back pain x 1 month. Pt states right butt cheek and down right thigh is numb intermittantly.Pt states he has been seen for this before and given prescript but did not take half of script according to pt. No injury. No other sx. No other urinary sx.

## 2015-01-25 ENCOUNTER — Other Ambulatory Visit: Payer: Self-pay | Admitting: Family Medicine

## 2015-02-24 ENCOUNTER — Encounter: Payer: Self-pay | Admitting: Family Medicine

## 2015-02-24 ENCOUNTER — Ambulatory Visit (INDEPENDENT_AMBULATORY_CARE_PROVIDER_SITE_OTHER): Payer: Managed Care, Other (non HMO) | Admitting: Family Medicine

## 2015-02-24 VITALS — BP 132/83 | HR 65 | Wt 203.0 lb

## 2015-02-24 DIAGNOSIS — I10 Essential (primary) hypertension: Secondary | ICD-10-CM

## 2015-02-24 DIAGNOSIS — R05 Cough: Secondary | ICD-10-CM

## 2015-02-24 DIAGNOSIS — R059 Cough, unspecified: Secondary | ICD-10-CM

## 2015-02-24 MED ORDER — LISINOPRIL 20 MG PO TABS: ORAL_TABLET | ORAL | Status: DC

## 2015-02-24 MED ORDER — PREDNISONE 20 MG PO TABS: ORAL_TABLET | ORAL | Status: AC

## 2015-02-24 NOTE — Progress Notes (Signed)
CC: Tyler Warren is a 44 y.o. male is here for Hypertension   Subjective: HPI:   follow up essential hypertension: taking lisinopril on a daily basis with no outside blood pressures to report. He denies chest pain shortness of breath and peripheral edema nor any motor or sensory disturbances.  Complains of a cough that has been present on a daily basis for the past month. No interventions as of yet. Occasional wheezing at night. Nothing seems to make it better or worse. Mild in severity nonproductive without any blood in the sputum. Denies any shortness of breath. Denies fevers, chills, nor confusion. Review of systems positive for postnasal drip   Review Of Systems Outlined In HPI  Past Medical History  Diagnosis Date  . Head trauma   . Hypertension     Past Surgical History  Procedure Laterality Date  . Tracheostomy     No family history on file.  History   Social History  . Marital Status: Married    Spouse Name: N/A  . Number of Children: N/A  . Years of Education: N/A   Occupational History  . Not on file.   Social History Main Topics  . Smoking status: Never Smoker   . Smokeless tobacco: Not on file  . Alcohol Use: 3.5 oz/week    7 Standard drinks or equivalent per week  . Drug Use: No  . Sexual Activity: No   Other Topics Concern  . Not on file   Social History Narrative     Objective: BP 132/83 mmHg  Pulse 65  Wt 203 lb (92.08 kg)  General: Alert and Oriented, No Acute Distress HEENT: Pupils equal, round, reactive to light. Conjunctivae clear.  External ears unremarkable, canals clear with intact TMs with appropriate landmarks.  Middle ear appears open without effusion. Pink inferior turbinates.  Moist mucous membranes, pharynx without inflammation nor lesions.  Neck supple without palpable lymphadenopathy nor abnormal masses. Lungs: Clear to auscultation bilaterally, no wheezing/ronchi/rales.  Comfortable work of breathing. Good air movement. Cardiac:  Regular rate and rhythm. Normal S1/S2.  No murmurs, rubs, nor gallops.   Mental Status: No depression, anxiety, nor agitation. Skin: Warm and dry.  Assessment & Plan: Tyler Warren was seen today for hypertension.  Diagnoses and all orders for this visit:  Essential hypertension, benign  Cough  Other orders -     lisinopril (PRINIVIL,ZESTRIL) 20 MG tablet; ONE TABLET BY MOUTH DAILY FOR BLOOD PRESSURE CONTROL. -     predniSONE (DELTASONE) 20 MG tablet; Two tabs at once daily for five days.   Essential hypertension: Controlled continue lisinopril Cough: Suspect postnasal drip is causing this, 5 day burst of prednisone, provided this is beneficial began as needed daily Zyrtec  Return for 1-2 months for CPE.

## 2015-04-28 ENCOUNTER — Ambulatory Visit (INDEPENDENT_AMBULATORY_CARE_PROVIDER_SITE_OTHER): Payer: Managed Care, Other (non HMO) | Admitting: Family Medicine

## 2015-04-28 ENCOUNTER — Encounter: Payer: Self-pay | Admitting: Family Medicine

## 2015-04-28 VITALS — BP 132/89 | HR 61 | Ht 69.0 in | Wt 203.0 lb

## 2015-04-28 DIAGNOSIS — Z0000 Encounter for general adult medical examination without abnormal findings: Secondary | ICD-10-CM | POA: Diagnosis not present

## 2015-04-28 LAB — COMPLETE METABOLIC PANEL WITH GFR
ALBUMIN: 4.1 g/dL (ref 3.5–5.2)
ALT: 45 U/L (ref 0–53)
AST: 28 U/L (ref 0–37)
Alkaline Phosphatase: 54 U/L (ref 39–117)
BILIRUBIN TOTAL: 0.7 mg/dL (ref 0.2–1.2)
BUN: 16 mg/dL (ref 6–23)
CALCIUM: 8.9 mg/dL (ref 8.4–10.5)
CO2: 21 meq/L (ref 19–32)
Chloride: 108 mEq/L (ref 96–112)
Creat: 0.84 mg/dL (ref 0.50–1.35)
GFR, Est African American: >89 mL/min
GLUCOSE: 82 mg/dL (ref 70–99)
POTASSIUM: 4.3 meq/L (ref 3.5–5.3)
Sodium: 140 mEq/L (ref 135–145)
Total Protein: 6.6 g/dL (ref 6.0–8.3)

## 2015-04-28 LAB — CBC
HCT: 42.9 % (ref 39.0–52.0)
Hemoglobin: 14.8 g/dL (ref 13.0–17.0)
MCH: 31.2 pg (ref 26.0–34.0)
MCHC: 34.5 g/dL (ref 30.0–36.0)
MCV: 90.5 fL (ref 78.0–100.0)
MPV: 11.3 fL (ref 8.6–12.4)
Platelets: 203 10*3/uL (ref 150–400)
RBC: 4.74 MIL/uL (ref 4.22–5.81)
RDW: 13.1 % (ref 11.5–15.5)
WBC: 7.6 10*3/uL (ref 4.0–10.5)

## 2015-04-28 LAB — LIPID PANEL
Cholesterol: 213 mg/dL — ABNORMAL HIGH (ref 0–200)
HDL: 45 mg/dL (ref >=40)
LDL Cholesterol: 120 mg/dL — ABNORMAL HIGH (ref 0–99)
TRIGLYCERIDES: 238 mg/dL — AB (ref <150)
Total CHOL/HDL Ratio: 4.7 Ratio
VLDL: 48 mg/dL — ABNORMAL HIGH (ref 0–40)

## 2015-04-28 NOTE — Progress Notes (Signed)
CC: Tyler CamaraJames Warren is a 44 y.o. male is here for Annual Exam   Subjective: HPI:  No known family history of prostate cancer or colon cancer, discussed screening at age 44   Influenza Vaccine: No current indication Pneumovax: No current indication Td/Tdap: UTD July 2015 Zoster: (Start 44 yo) Presents for complete physical exam with no acute complaints. No return of epigastric pain.  Review of Systems - General ROS: negative for - chills, fever, night sweats, weight gain or weight loss Ophthalmic ROS: negative for - decreased vision Psychological ROS: negative for - anxiety or depression ENT ROS: negative for - hearing change, nasal congestion, tinnitus or allergies Hematological and Lymphatic ROS: negative for - bleeding problems, bruising or swollen lymph nodes Breast ROS: negative Respiratory ROS: no cough, shortness of breath, or wheezing Cardiovascular ROS: no chest pain or dyspnea on exertion Gastrointestinal ROS: no abdominal pain, change in bowel habits, or black or bloody stools Genito-Urinary ROS: negative for - genital discharge, genital ulcers, incontinence or abnormal bleeding from genitals Musculoskeletal ROS: negative for - joint pain or muscle pain Neurological ROS: negative for - headaches or memory loss Dermatological ROS: negative for lumps, mole changes, rash and skin lesion changes  Past Medical History  Diagnosis Date  . Head trauma   . Hypertension     Past Surgical History  Procedure Laterality Date  . Tracheostomy     No family history on file.  History   Social History  . Marital Status: Married    Spouse Name: N/A  . Number of Children: N/A  . Years of Education: N/A   Occupational History  . Not on file.   Social History Main Topics  . Smoking status: Never Smoker   . Smokeless tobacco: Not on file  . Alcohol Use: 3.5 oz/week    7 Standard drinks or equivalent per week  . Drug Use: No  . Sexual Activity: No   Other Topics Concern  . Not  on file   Social History Narrative     Objective: BP 132/89 mmHg  Pulse 61  Ht 5\' 9"  (1.753 m)  Wt 203 lb (92.08 kg)  BMI 29.96 kg/m2  General: No Acute Distress HEENT: Atraumatic, normocephalic, conjunctivae normal without scleral icterus.  No nasal discharge, hearing grossly intact, TMs with good landmarks bilaterally with no middle ear abnormalities, posterior pharynx clear without oral lesions. Neck: Supple, trachea midline, no cervical nor supraclavicular adenopathy. Pulmonary: Clear to auscultation bilaterally without wheezing, rhonchi, nor rales. Cardiac: Regular rate and rhythm.  No murmurs, rubs, nor gallops. No peripheral edema.  2+ peripheral pulses bilaterally. Abdomen: Bowel sounds normal.  No masses.  Non-tender without rebound.  Negative Murphy's sign. MSK: Grossly intact, no signs of weakness.  Full strength throughout upper and lower extremities.  Full ROM in upper and lower extremities.  No midline spinal tenderness. Neuro: Gait unremarkable, CN II-XII grossly intact.  C5-C6 Reflex 2/4 Bilaterally, L4 Reflex 2/4 Bilaterally.  Cerebellar function intact. Skin: No rashes. Psych: Alert and oriented to person/place/time.  Thought process normal. No anxiety/depression. Assessment & Plan: Tyler FearingJames was seen today for annual exam.  Diagnoses and all orders for this visit:  Annual physical exam Orders: -     Lipid panel -     COMPLETE METABOLIC PANEL WITH GFR -     CBC   Healthy lifestyle interventions including but not limited to regular exercise, a healthy low fat diet, moderation of salt intake, the dangers of tobacco/alcohol/recreational drug use, nutrition supplementation, and  accident avoidance were discussed with the patient and a handout was provided for future reference. Return in about 6 months (around 10/29/2015) for BP Review.

## 2015-04-28 NOTE — Patient Instructions (Signed)
Dr. Roxane Puerto's General Advice Following Your Complete Physical Exam  The Benefits of Regular Exercise: Unless you suffer from an uncontrolled cardiovascular condition, studies strongly suggest that regular exercise and physical activity will add to both the quality and length of your life.  The World Health Organization recommends 150 minutes of moderate intensity aerobic activity every week.  This is best split over 3-4 days a week, and can be as simple as a brisk walk for just over 35 minutes "most days of the week".  This type of exercise has been shown to lower LDL-Cholesterol, lower average blood sugars, lower blood pressure, lower cardiovascular disease risk, improve memory, and increase one's overall sense of wellbeing.  The addition of anaerobic (or "strength training") exercises offers additional benefits including but not limited to increased metabolism, prevention of osteoporosis, and improved overall cholesterol levels.  How Can I Strive For A Low-Fat Diet?: Current guidelines recommend that 25-35 percent of your daily energy (food) intake should come from fats.  One might ask how can this be achieved without having to dissect each meal on a daily basis?  Switch to skim or 1% milk instead of whole milk.  Focus on lean meats such as ground turkey, fresh fish, baked chicken, and lean cuts of beef as your source of dietary protein.  Limit saturated fat consumption to less than 10% of your daily caloric intake.  Limit trans fatty acid consumption primarily by limiting synthetic trans fats such as partially hydrogenated oils (Ex: fried fast foods).  Substitute olive or vegetable oil for solid fats where possible.  Moderation of Salt Intake: Provided you don't carry a diagnosis of congestive heart failure nor renal failure, I recommend a daily allowance of no more than 2300 mg of salt (sodium).  Keeping under this daily goal is associated with a decreased risk of cardiovascular events, creeping  above it can lead to elevated blood pressures and increases your risk of cardiovascular events.  Milligrams (mg) of salt is listed on all nutrition labels, and your daily intake can add up faster than you think.  Most canned and frozen dinners can pack in over half your daily salt allowance in one meal.    Lifestyle Health Risks: Certain lifestyle choices carry specific health risks.  As you may already know, tobacco use has been associated with increasing one's risk of cardiovascular disease, pulmonary disease, numerous cancers, among many other issues.  What you may not know is that there are medications and nicotine replacement strategies that can more than double your chances of successfully quitting.  I would be thrilled to help manage your quitting strategy if you currently use tobacco products.  When it comes to alcohol use, I've yet to find an "ideal" daily allowance.  Provided an individual does not have a medical condition that is exacerbated by alcohol consumption, general guidelines determine "safe drinking" as no more than two standard drinks for a man or no more than one standard drink for a male per day.  However, much debate still exists on whether any amount of alcohol consumption is technically "safe".  My general advice, keep alcohol consumption to a minimum for general health promotion.  If you or others believe that alcohol, tobacco, or recreational drug use is interfering with your life, I would be happy to provide confidential counseling regarding treatment options.  General "Over The Counter" Nutrition Advice: Postmenopausal women should aim for a daily calcium intake of 1200 mg, however a significant portion of this might already be   provided by diets including milk, yogurt, cheese, and other dairy products.  Vitamin D has been shown to help preserve bone density, prevent fatigue, and has even been shown to help reduce falls in the elderly.  Ensuring a daily intake of 800 Units of  Vitamin D is a good place to start to enjoy the above benefits, we can easily check your Vitamin D level to see if you'd potentially benefit from supplementation beyond 800 Units a day.  Folic Acid intake should be of particular concern to women of childbearing age.  Daily consumption of 400-800 mcg of Folic Acid is recommended to minimize the chance of spinal cord defects in a fetus should pregnancy occur.    For many adults, accidents still remain one of the most common culprits when it comes to cause of death.  Some of the simplest but most effective preventitive habits you can adopt include regular seatbelt use, proper helmet use, securing firearms, and regularly testing your smoke and carbon monoxide detectors.  Sadiel Mota B. Audra Bellard DO [facility] [address], [address] [place], [place] [address] Phone: [phone]  

## 2015-04-29 ENCOUNTER — Encounter: Payer: Self-pay | Admitting: Family Medicine

## 2015-04-29 DIAGNOSIS — E785 Hyperlipidemia, unspecified: Secondary | ICD-10-CM | POA: Insufficient documentation

## 2015-05-20 ENCOUNTER — Emergency Department (HOSPITAL_BASED_OUTPATIENT_CLINIC_OR_DEPARTMENT_OTHER): Payer: Managed Care, Other (non HMO)

## 2015-05-20 ENCOUNTER — Encounter (HOSPITAL_BASED_OUTPATIENT_CLINIC_OR_DEPARTMENT_OTHER): Payer: Self-pay | Admitting: Emergency Medicine

## 2015-05-20 ENCOUNTER — Emergency Department (HOSPITAL_BASED_OUTPATIENT_CLINIC_OR_DEPARTMENT_OTHER)
Admission: EM | Admit: 2015-05-20 | Discharge: 2015-05-20 | Disposition: A | Discharge status: Home / Self Care | Payer: Managed Care, Other (non HMO) | Source: Home / Self Care | Attending: Emergency Medicine | Admitting: Emergency Medicine

## 2015-05-20 DIAGNOSIS — I10 Essential (primary) hypertension: Secondary | ICD-10-CM

## 2015-05-20 DIAGNOSIS — Z87828 Personal history of other (healed) physical injury and trauma: Secondary | ICD-10-CM

## 2015-05-20 DIAGNOSIS — Z79899 Other long term (current) drug therapy: Secondary | ICD-10-CM

## 2015-05-20 DIAGNOSIS — K852 Alcohol induced acute pancreatitis: Secondary | ICD-10-CM | POA: Diagnosis not present

## 2015-05-20 DIAGNOSIS — K859 Acute pancreatitis, unspecified: Secondary | ICD-10-CM | POA: Insufficient documentation

## 2015-05-20 DIAGNOSIS — R1110 Vomiting, unspecified: Secondary | ICD-10-CM

## 2015-05-20 LAB — COMPREHENSIVE METABOLIC PANEL
ALBUMIN: 4.2 g/dL (ref 3.5–5.0)
ALT: 48 U/L (ref 17–63)
ANION GAP: 8 (ref 5–15)
AST: 32 U/L (ref 15–41)
Alkaline Phosphatase: 58 U/L (ref 38–126)
BUN: 17 mg/dL (ref 6–20)
CHLORIDE: 105 mmol/L (ref 101–111)
CO2: 26 mmol/L (ref 22–32)
Calcium: 9 mg/dL (ref 8.9–10.3)
Creatinine, Ser: 0.95 mg/dL (ref 0.61–1.24)
GLUCOSE: 120 mg/dL — AB (ref 65–99)
Potassium: 4.1 mmol/L (ref 3.5–5.1)
SODIUM: 139 mmol/L (ref 135–145)
Total Bilirubin: 0.8 mg/dL (ref 0.3–1.2)
Total Protein: 7.8 g/dL (ref 6.5–8.1)

## 2015-05-20 LAB — URINALYSIS, ROUTINE W REFLEX MICROSCOPIC
BILIRUBIN URINE: NEGATIVE
Glucose, UA: NEGATIVE mg/dL
HGB URINE DIPSTICK: NEGATIVE
Ketones, ur: NEGATIVE mg/dL
LEUKOCYTES UA: NEGATIVE
Nitrite: NEGATIVE
PH: 6 (ref 5.0–8.0)
Protein, ur: NEGATIVE mg/dL
Specific Gravity, Urine: 1.028 (ref 1.005–1.030)
UROBILINOGEN UA: 0.2 mg/dL (ref 0.0–1.0)

## 2015-05-20 LAB — CBC
HEMATOCRIT: 43.3 % (ref 39.0–52.0)
HEMOGLOBIN: 15.3 g/dL (ref 13.0–17.0)
MCH: 31.9 pg (ref 26.0–34.0)
MCHC: 35.3 g/dL (ref 30.0–36.0)
MCV: 90.2 fL (ref 78.0–100.0)
PLATELETS: 190 10*3/uL (ref 150–400)
RBC: 4.8 MIL/uL (ref 4.22–5.81)
RDW: 12.5 % (ref 11.5–15.5)
WBC: 12.8 10*3/uL — AB (ref 4.0–10.5)

## 2015-05-20 LAB — LIPASE, BLOOD: Lipase: 52 U/L — ABNORMAL HIGH (ref 22–51)

## 2015-05-20 MED ORDER — ONDANSETRON HCL 4 MG PO TABS: 4.0000 mg | ORAL_TABLET | Freq: Four times a day (QID) | ORAL | Status: DC

## 2015-05-20 MED ORDER — ONDANSETRON HCL 4 MG/2ML IJ SOLN
4.0000 mg | Freq: Once | INTRAMUSCULAR | Status: AC
  Administered 2015-05-20: 4 mg via INTRAVENOUS
  Filled 2015-05-20: qty 2

## 2015-05-20 MED ORDER — IOHEXOL 300 MG/ML  SOLN
25.0000 mL | Freq: Once | INTRAMUSCULAR | Status: AC | PRN
  Administered 2015-05-20: 25 mL via ORAL

## 2015-05-20 MED ORDER — SODIUM CHLORIDE 0.9 % IV BOLUS (SEPSIS)
1000.0000 mL | Freq: Once | INTRAVENOUS | Status: AC
  Administered 2015-05-20: 1000 mL via INTRAVENOUS

## 2015-05-20 MED ORDER — HYDROMORPHONE HCL 1 MG/ML IJ SOLN
1.0000 mg | Freq: Once | INTRAMUSCULAR | Status: AC
  Administered 2015-05-20: 1 mg via INTRAVENOUS
  Filled 2015-05-20: qty 1

## 2015-05-20 MED ORDER — IOHEXOL 300 MG/ML  SOLN
100.0000 mL | Freq: Once | INTRAMUSCULAR | Status: AC | PRN
  Administered 2015-05-20: 100 mL via INTRAVENOUS

## 2015-05-20 MED ORDER — OXYCODONE-ACETAMINOPHEN 5-325 MG PO TABS: 1.0000 | ORAL_TABLET | Freq: Four times a day (QID) | ORAL | Status: DC | PRN

## 2015-05-20 NOTE — ED Provider Notes (Signed)
CSN: [id number]     Arrival date & time 05/20/15  1913 History  This chart was scribed for Jerelyn Scott, MD by Doreatha Martin, ED Scribe. This patient was seen in room MH04/MH04 and the patient's care was started at 7:40 PM.     Chief Complaint  Patient presents with  . Emesis   Patient is a 44 y.o. male presenting with vomiting. The history is provided by the patient. No language interpreter was used.  Emesis Severity:  Moderate Duration:  2 hours Timing:  Intermittent Chronicity:  New Relieved by:  Nothing Worsened by:  Nothing tried Ineffective treatments:  None tried Associated symptoms: abdominal pain   Associated symptoms: no diarrhea and no fever   Risk factors: no prior abdominal surgery     HPI Comments: Tyler Warren is a 44 y.o. male with Hx of HTN who presents to the Emergency Department complaining of moderate, intermittent emesis onset 2 hours ago. He states associated moderate epigastric and LUQ abdominal pain onset 7 hours ago, nausea, abdominal distention. Pt states that the last thing he ate was watermelon. Pt notes a Hx of pancreatitis for 2 years, exacerbated by consumption of red meat, and states that these symptoms feel similar. He states that he drinks alcohol occasionally. NKDA. He denies Hx of abdominal surgery. He also denies diarrhea, fever.   Past Medical History  Diagnosis Date  . Head trauma   . Hypertension   . Alcoholic pancreatitis   . Alcohol abuse    Past Surgical History  Procedure Laterality Date  . Tracheostomy     History reviewed. No pertinent family history. History  Substance Use Topics  . Smoking status: Never Smoker   . Smokeless tobacco: Not on file  . Alcohol Use: 3.5 oz/week    7 Standard drinks or equivalent per week    Review of Systems  Constitutional: Negative for fever.  Gastrointestinal: Positive for nausea, vomiting, abdominal pain and abdominal distention. Negative for diarrhea.  All other systems reviewed and are  negative.  Allergies  Review of patient's allergies indicates no known allergies.  Home Medications   Prior to Admission medications   Medication Sig Start Date End Date Taking? Authorizing Provider  lisinopril (PRINIVIL,ZESTRIL) 20 MG tablet ONE TABLET BY MOUTH DAILY FOR BLOOD PRESSURE CONTROL. 02/24/15   Sean Hommel, DO  ondansetron (ZOFRAN) 4 MG tablet Take 1 tablet (4 mg total) by mouth every 6 (six) hours. 05/20/15   Jerelyn Scott, MD  oxyCODONE-acetaminophen (PERCOCET/ROXICET) 5-325 MG per tablet Take 1-2 tablets by mouth every 6 (six) hours as needed for severe pain. 05/20/15   Jerelyn Scott, MD   BP 153/102 mmHg  Pulse 78  Temp(Src) 98.7 F (37.1 C) (Oral)  Resp 18  Ht  (1.753 m)  Wt 205 lb (92.987 kg)  BMI 30.26 kg/m2  SpO2 97% Vitals reviewed Physical Exam  Physical Examination: General appearance - alert, well appearing, and in no distress Mental status - alert, oriented to person, place, and time Eyes - no conjunctival injection, no scleral icterus Mouth - mucous membranes moist, pharynx normal without lesions Chest - clear to auscultation, no wheezes, rales or rhonchi, symmetric air entry Heart - normal rate, regular rhythm, normal S1, S2, no murmurs, rubs, clicks or gallops Abdomen - soft, ttp in mid abdomen, nabs, nondistended, no masses or organomegaly, no gaurding or rebound tenderness Neurological - alert, oriented, normal speech Extremities - peripheral pulses normal, no pedal edema, no clubbing or cyanosis Skin - petechiae around bilateral  lower eyelids, otherwise normal coloration and turgor, no rashes  ED Course  Procedures (including critical care time) DIAGNOSTIC STUDIES: Oxygen Saturation is 97% on RA, normal by my interpretation.    COORDINATION OF CARE: 7:44 PM Discussed treatment plan with pt at bedside and pt agreed to plan.   Labs Review Labs Reviewed  CBC - Abnormal; Notable for the following:    WBC 12.8 (*)    All other components within  normal limits  COMPREHENSIVE METABOLIC PANEL - Abnormal; Notable for the following:    Glucose, Bld 120 (*)    All other components within normal limits  LIPASE, BLOOD - Abnormal; Notable for the following:    Lipase 52 (*)    All other components within normal limits  URINALYSIS, ROUTINE W REFLEX MICROSCOPIC (NOT AT [facility])    Imaging Review US Abdomen Complete  05/21/2015   CLINICAL DATA:  Recurrent alcoholic pancreatitis  EXAM: ULTRASOUND ABDOMEN COMPLETE  COMPARISON:  CT 05/20/2015  FINDINGS: Gallbladder: No gallstones or wall thickening visualized. No sonographic Murphy sign noted.  Common bile duct: Diameter: 4 mm  Liver: Hepatic cysts are reidentified. No solid focal lesion. No intrahepatic ductal dilatation.  IVC: No abnormality visualized.  Pancreas: Obscured by bowel gas  Spleen: Size and appearance within normal limits.  Right Kidney: Length: 11.6 cm. Echogenicity within normal limits. No mass or hydronephrosis visualized.  Left Kidney: Length: 11.2 cm. Echogenicity within normal limits. No mass or hydronephrosis visualized.  Abdominal aorta: No aneurysm visualized.  Other findings: None.  IMPRESSION: Normal exam.   Electronically Signed   By: Christiana Pellant M.D.   On: 05/21/2015 08:20   Ct Abdomen Pelvis W Contrast  05/20/2015   CLINICAL DATA:  Generalized abdominal pain, nausea and vomiting today.  EXAM: CT ABDOMEN AND PELVIS WITH CONTRAST  TECHNIQUE: Multidetector CT imaging of the abdomen and pelvis was performed using the standard protocol following bolus administration of intravenous contrast.  CONTRAST:  25mL OMNIPAQUE IOHEXOL 300 MG/ML SOLN, OMNIPAQUE IOHEXOL 300 MG/ML SOLN  COMPARISON:  None.  FINDINGS: Lower chest: The lung bases are clear of acute process. Very small bilateral pleural effusions and minimal basilar atelectasis. The heart is normal in size. No pericardial effusion.  Hepatobiliary: Small scattered low-attenuation liver lesions are likely benign cysts. No  worrisome hepatic lesions or intrahepatic biliary dilatation. The gallbladder is normal. No common bile duct dilatation.  Pancreas: There is fullness and mild inflammation involving the pancreatic tail suggesting pancreatitis. There are surrounding hyperplastic/inflamed lymph nodes. No pancreatic mass or ductal dilatation.  Spleen: Normal size.  No focal lesions.  Adrenals/Urinary Tract: The adrenal glands are normal. Both kidneys are normal.  Stomach/Bowel: The stomach, duodenum, small bowel and colon are unremarkable. No inflammatory changes, mass lesions or obstructive findings. The terminal ileum is normal. The appendix is normal.  Vascular/Lymphatic: The aorta and branch vessels are normal. The major venous structures are patent. Small scattered retroperitoneal lymph nodes but no mass or adenopathy  Other: The bladder, prostate gland and seminal vesicles are unremarkable. No pelvic mass or adenopathy. No free pelvic fluid collections. No inguinal mass or adenopathy.  Musculoskeletal: No significant bony findings.  IMPRESSION: CT findings is suggestive of mild pancreatitis involving the pancreatic tail region.   Electronically Signed   By: Rudie Meyer M.D.   On: 05/20/2015 21:50   Dg Abd Acute W/chest  05/20/2015   CLINICAL DATA:  Emesis and abdominal pain for 1 day.  Hypertension.  EXAM: DG ABDOMEN ACUTE W/ 1V  CHEST  COMPARISON:  Chest radiograph January 22, 2013  FINDINGS: PA chest: No edema or consolidation. Heart size and pulmonary vascularity are normal. No adenopathy. There is evidence of old trauma in the distal right clavicle region, stable.  Supine and upright abdomen: There is moderate stool in the colon. There is no bowel dilatation or air-fluid level suggesting obstruction. No free air. There is a tiny phlebolith in left pelvis.  IMPRESSION: Bowel gas pattern unremarkable. No obstruction or free air. No lung edema or consolidation.   Electronically Signed   By: Bretta Bang III M.D.   On:  05/20/2015 20:23     EKG Interpretation None      MDM   Final diagnoses:  Vomiting  Acute pancreatitis, unspecified pancreatitis type    Pt presenting with c/o abdominal pain and vomiting, labs reveal mildly elevated lipase- pt has hx of pancreatitis in the past.  Ct scan shows pancreatic inflammation.  Pt feels improved after IV fluids and morphine/zofran in the ED, he is tolerating po fluids in the ED.   Discharged with strict return precautions.  Pt agreeable with plan.   I personally performed the services described in this documentation, which was scribed in my presence. The recorded information has been reviewed and is accurate.   Jerelyn Scott, MD 05/21/15 1728

## 2015-05-20 NOTE — ED Notes (Signed)
Patient states that he has been vomiting since about 530. RUQ pain since 1230

## 2015-05-20 NOTE — Discharge Instructions (Signed)
Return to the ED with any concerns including vomiting and not able to keep down liquids, pain not controlled by pain medications, fever/chills, decreased level of alertness/lethargy, or any other alarming symptoms

## 2015-05-20 NOTE — ED Notes (Signed)
PT IS NPO 

## 2015-05-20 NOTE — ED Notes (Signed)
Patient transported to CT 

## 2015-05-20 NOTE — ED Notes (Signed)
Patient transported to X-ray 

## 2015-05-21 ENCOUNTER — Encounter (HOSPITAL_COMMUNITY): Payer: Self-pay | Admitting: Emergency Medicine

## 2015-05-21 ENCOUNTER — Inpatient Hospital Stay (HOSPITAL_COMMUNITY): Payer: Managed Care, Other (non HMO)

## 2015-05-21 ENCOUNTER — Inpatient Hospital Stay (HOSPITAL_COMMUNITY)
Admission: EM | Admit: 2015-05-21 | Discharge: 2015-05-25 | DRG: 438 | Disposition: A | Discharge status: Home / Self Care | Payer: Managed Care, Other (non HMO) | Attending: Internal Medicine | Admitting: Internal Medicine

## 2015-05-21 DIAGNOSIS — K858 Other acute pancreatitis without necrosis or infection: Secondary | ICD-10-CM

## 2015-05-21 DIAGNOSIS — R109 Unspecified abdominal pain: Secondary | ICD-10-CM

## 2015-05-21 DIAGNOSIS — J181 Lobar pneumonia, unspecified organism: Secondary | ICD-10-CM | POA: Diagnosis not present

## 2015-05-21 DIAGNOSIS — K2920 Alcoholic gastritis without bleeding: Secondary | ICD-10-CM | POA: Diagnosis present

## 2015-05-21 DIAGNOSIS — T40605A Adverse effect of unspecified narcotics, initial encounter: Secondary | ICD-10-CM | POA: Diagnosis not present

## 2015-05-21 DIAGNOSIS — K5900 Constipation, unspecified: Secondary | ICD-10-CM | POA: Diagnosis present

## 2015-05-21 DIAGNOSIS — F1010 Alcohol abuse, uncomplicated: Secondary | ICD-10-CM | POA: Diagnosis present

## 2015-05-21 DIAGNOSIS — K567 Ileus, unspecified: Secondary | ICD-10-CM | POA: Diagnosis not present

## 2015-05-21 DIAGNOSIS — K852 Alcohol induced acute pancreatitis: Secondary | ICD-10-CM | POA: Diagnosis present

## 2015-05-21 DIAGNOSIS — I10 Essential (primary) hypertension: Secondary | ICD-10-CM

## 2015-05-21 DIAGNOSIS — J189 Pneumonia, unspecified organism: Secondary | ICD-10-CM | POA: Diagnosis present

## 2015-05-21 DIAGNOSIS — K859 Acute pancreatitis without necrosis or infection, unspecified: Secondary | ICD-10-CM

## 2015-05-21 DIAGNOSIS — Z79899 Other long term (current) drug therapy: Secondary | ICD-10-CM

## 2015-05-21 DIAGNOSIS — R0602 Shortness of breath: Secondary | ICD-10-CM | POA: Diagnosis not present

## 2015-05-21 HISTORY — DX: Alcohol induced acute pancreatitis without necrosis or infection: K85.20

## 2015-05-21 HISTORY — DX: Alcohol abuse, uncomplicated: F10.10

## 2015-05-21 LAB — LIPID PANEL
Cholesterol: 218 mg/dL — ABNORMAL HIGH (ref 0–200)
HDL: 45 mg/dL (ref >40)
LDL Cholesterol: 147 mg/dL — ABNORMAL HIGH (ref 0–99)
TRIGLYCERIDES: 129 mg/dL (ref <150)
Total CHOL/HDL Ratio: 4.8 RATIO
VLDL: 26 mg/dL (ref 0–40)

## 2015-05-21 LAB — RAPID URINE DRUG SCREEN, HOSP PERFORMED
Amphetamines: NOT DETECTED
BARBITURATES: NOT DETECTED
Benzodiazepines: NOT DETECTED
Cocaine: NOT DETECTED
Opiates: POSITIVE — AB
TETRAHYDROCANNABINOL: NOT DETECTED

## 2015-05-21 LAB — PROTIME-INR
INR: 1.06 (ref 0.00–1.49)
Prothrombin Time: 14 seconds (ref 11.6–15.2)

## 2015-05-21 LAB — I-STAT CG4 LACTIC ACID, ED: LACTIC ACID, VENOUS: 1.38 mmol/L (ref 0.5–2.0)

## 2015-05-21 LAB — HIV ANTIBODY (ROUTINE TESTING W REFLEX): HIV Screen 4th Generation wRfx: NONREACTIVE

## 2015-05-21 LAB — APTT: APTT: 31 s (ref 24–37)

## 2015-05-21 MED ORDER — LORAZEPAM 2 MG/ML IJ SOLN: 1.0000 mg | Freq: Four times a day (QID) | INTRAMUSCULAR | Status: AC | PRN

## 2015-05-21 MED ORDER — ONDANSETRON HCL 4 MG/2ML IJ SOLN
4.0000 mg | Freq: Three times a day (TID) | INTRAMUSCULAR | Status: DC | PRN
Start: 2015-05-21 — End: 2015-05-25
  Administered 2015-05-21: 4 mg via INTRAVENOUS
  Filled 2015-05-21: qty 2

## 2015-05-21 MED ORDER — LORAZEPAM 2 MG/ML IJ SOLN: 0.0000 mg | Freq: Two times a day (BID) | INTRAMUSCULAR | Status: AC

## 2015-05-21 MED ORDER — DICYCLOMINE HCL 10 MG/ML IM SOLN
20.0000 mg | Freq: Once | INTRAMUSCULAR | Status: AC
  Administered 2015-05-21: 20 mg via INTRAMUSCULAR
  Filled 2015-05-21: qty 2

## 2015-05-21 MED ORDER — MORPHINE SULFATE 2 MG/ML IJ SOLN
2.0000 mg | INTRAMUSCULAR | Status: DC | PRN
  Administered 2015-05-21: 2 mg via INTRAVENOUS
  Filled 2015-05-21: qty 1

## 2015-05-21 MED ORDER — LORAZEPAM 1 MG PO TABS: 1.0000 mg | ORAL_TABLET | Freq: Four times a day (QID) | ORAL | Status: AC | PRN

## 2015-05-21 MED ORDER — MORPHINE SULFATE 2 MG/ML IJ SOLN
2.0000 mg | INTRAMUSCULAR | Status: DC | PRN
Start: 2015-05-21 — End: 2015-05-21

## 2015-05-21 MED ORDER — THIAMINE HCL 100 MG/ML IJ SOLN
100.0000 mg | Freq: Every day | INTRAMUSCULAR | Status: DC
  Administered 2015-05-21: 100 mg via INTRAVENOUS
  Filled 2015-05-21: qty 2

## 2015-05-21 MED ORDER — MORPHINE SULFATE 4 MG/ML IJ SOLN
4.0000 mg | Freq: Once | INTRAMUSCULAR | Status: AC
  Administered 2015-05-21: 4 mg via INTRAVENOUS
  Filled 2015-05-21: qty 1

## 2015-05-21 MED ORDER — ONDANSETRON HCL 4 MG/2ML IJ SOLN
4.0000 mg | Freq: Once | INTRAMUSCULAR | Status: AC
  Administered 2015-05-21: 4 mg via INTRAVENOUS
  Filled 2015-05-21: qty 2

## 2015-05-21 MED ORDER — ADULT MULTIVITAMIN W/MINERALS CH
1.0000 | ORAL_TABLET | Freq: Every day | ORAL | Status: DC
  Administered 2015-05-22 – 2015-05-25 (×4): 1 via ORAL
  Filled 2015-05-21 (×5): qty 1

## 2015-05-21 MED ORDER — LISINOPRIL 20 MG PO TABS
20.0000 mg | ORAL_TABLET | Freq: Every day | ORAL | Status: DC
  Filled 2015-05-21: qty 1

## 2015-05-21 MED ORDER — ALBUTEROL SULFATE (2.5 MG/3ML) 0.083% IN NEBU: 2.5000 mg | INHALATION_SOLUTION | RESPIRATORY_TRACT | Status: DC | PRN

## 2015-05-21 MED ORDER — HYDRALAZINE HCL 20 MG/ML IJ SOLN: 10.0000 mg | Freq: Four times a day (QID) | INTRAMUSCULAR | Status: DC | PRN

## 2015-05-21 MED ORDER — LORAZEPAM 2 MG/ML IJ SOLN: 0.0000 mg | Freq: Four times a day (QID) | INTRAMUSCULAR | Status: AC

## 2015-05-21 MED ORDER — HYDROMORPHONE HCL 1 MG/ML IJ SOLN
1.0000 mg | INTRAMUSCULAR | Status: DC | PRN
  Administered 2015-05-21 – 2015-05-23 (×9): 1 mg via INTRAVENOUS
  Filled 2015-05-21 (×11): qty 1

## 2015-05-21 MED ORDER — OXYCODONE-ACETAMINOPHEN 5-325 MG PO TABS
1.0000 | ORAL_TABLET | Freq: Four times a day (QID) | ORAL | Status: DC | PRN
  Administered 2015-05-24: 1 via ORAL
  Filled 2015-05-21: qty 1

## 2015-05-21 MED ORDER — HEPARIN SODIUM (PORCINE) 5000 UNIT/ML IJ SOLN
5000.0000 [IU] | Freq: Three times a day (TID) | INTRAMUSCULAR | Status: DC
  Administered 2015-05-21 – 2015-05-23 (×6): 5000 [IU] via SUBCUTANEOUS
  Filled 2015-05-21 (×9): qty 1

## 2015-05-21 MED ORDER — ALUM & MAG HYDROXIDE-SIMETH 200-200-20 MG/5ML PO SUSP: 30.0000 mL | Freq: Four times a day (QID) | ORAL | Status: DC | PRN

## 2015-05-21 MED ORDER — SIMETHICONE 40 MG/0.6ML PO SUSP (UNIT DOSE)
40.0000 mg | Freq: Once | ORAL | Status: AC
  Administered 2015-05-21: 40 mg via ORAL
  Filled 2015-05-21: qty 0.6

## 2015-05-21 MED ORDER — VITAMIN B-1 100 MG PO TABS
100.0000 mg | ORAL_TABLET | Freq: Every day | ORAL | Status: DC
  Administered 2015-05-22 – 2015-05-25 (×4): 100 mg via ORAL
  Filled 2015-05-21 (×5): qty 1

## 2015-05-21 MED ORDER — SODIUM CHLORIDE 0.9 % IV SOLN
INTRAVENOUS | Status: DC
  Administered 2015-05-21 – 2015-05-23 (×7): via INTRAVENOUS
  Administered 2015-05-23: 1000 mL via INTRAVENOUS
  Administered 2015-05-24: 16:00:00 via INTRAVENOUS

## 2015-05-21 MED ORDER — HYDROMORPHONE HCL 1 MG/ML IJ SOLN
1.0000 mg | Freq: Once | INTRAMUSCULAR | Status: AC
  Administered 2015-05-21: 1 mg via INTRAVENOUS
  Filled 2015-05-21: qty 1

## 2015-05-21 MED ORDER — FOLIC ACID 1 MG PO TABS
1.0000 mg | ORAL_TABLET | Freq: Every day | ORAL | Status: DC
  Administered 2015-05-22 – 2015-05-25 (×4): 1 mg via ORAL
  Filled 2015-05-21 (×5): qty 1

## 2015-05-21 MED ORDER — PANTOPRAZOLE SODIUM 40 MG PO TBEC
40.0000 mg | DELAYED_RELEASE_TABLET | Freq: Every day | ORAL | Status: DC
  Administered 2015-05-22 – 2015-05-25 (×4): 40 mg via ORAL
  Filled 2015-05-21 (×4): qty 1

## 2015-05-21 NOTE — Progress Notes (Signed)
  Pt admitted to the unit. Pt is stable, alert and oriented per baseline. Oriented to room, staff, and call bell. Educated to call for any assistance. Bed in lowest position, call bell within reach- will continue to monitor. 

## 2015-05-21 NOTE — ED Notes (Signed)
Patient seen at [facility] with abdominal pain, nausea and vomiting and was told that he had pancreatitis.  Patient with continued pain and nausea and vomiting.

## 2015-05-21 NOTE — ED Provider Notes (Signed)
CSN: [id number]     Arrival date & time 05/21/15  0233 History  This chart was scribed for Marisa Severin, MD by Lyndel Safe, ED Scribe. This patient was seen in room A11C/A11C and the patient's care was started 2:56 AM.   Chief Complaint  Patient presents with  . Abdominal Pain   The history is provided by the patient. No language interpreter was used.   HPI Comments: Tyler Warren is a 44 y.o. male, with a PMhx of pancreatitis and HTN, who presents to the Emergency Department complaining of persistent, constant, moderate nausea with intermittent episodes of emesis onset 1 day ago. He notes associated suprapubic abdominal pain which he describes as a presssure and bloating with the feeling of having to pass gas, also onset 1 day ago. Pt was evaluated at [facility] earlier this evening c/o abdominal pain, nausea and vomiting when he was diagnosed with pancreatitis. He was discharged from [facility] with oxycodone and zofran. Pt states he has taken the zofran and was able to keep it down but has not taken the pain medication. He reports sick contacts at work where his co-workers have been ill with a GI virus. 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 Pt has reduced his red meat intake but notes he still occasionally consumes EtOH, last consumption was 3 days ago. Last normal BM was 1 day ago, yesterday morning. Denies fevers or chills.   Past Medical History  Diagnosis Date  . Head trauma   . Hypertension    Past Surgical History  Procedure Laterality Date  . Tracheostomy     History reviewed. No pertinent family history. History  Substance Use Topics  . Smoking status: Never Smoker   . Smokeless tobacco: Not on file  . Alcohol Use: 3.5 oz/week    7 Standard drinks or equivalent per week    Review of Systems  Constitutional: Negative for fever and chills.  Gastrointestinal: Positive for nausea, vomiting and abdominal pain.  All other systems reviewed and are  negative.  Allergies  Review of patient's allergies indicates no known allergies.  Home Medications   Prior to Admission medications   Medication Sig Start Date End Date Taking? Authorizing Provider  lisinopril (PRINIVIL,ZESTRIL) 20 MG tablet ONE TABLET BY MOUTH DAILY FOR BLOOD PRESSURE CONTROL. 02/24/15   Sean Hommel, DO  ondansetron (ZOFRAN) 4 MG tablet Take 1 tablet (4 mg total) by mouth every 6 (six) hours. 05/20/15   Jerelyn Scott, MD  oxyCODONE-acetaminophen (PERCOCET/ROXICET) 5-325 MG per tablet Take 1-2 tablets by mouth every 6 (six) hours as needed for severe pain. 05/20/15   Jerelyn Scott, MD   BP 155/98 mmHg  Pulse 78  Temp(Src) 98.1 F (36.7 C) (Oral)  Resp 18  Ht 5\' 9"  (1.753 m)  Wt 205 lb (92.987 kg)  BMI 30.26 kg/m2  SpO2 94% Physical Exam  Constitutional: He is oriented to person, place, and time. He appears well-developed and well-nourished. He appears distressed.  HENT:  Head: Normocephalic and atraumatic.  Nose: Nose normal.  Mouth/Throat: Oropharynx is clear and moist.  Eyes: Conjunctivae and EOM are normal. Pupils are equal, round, and reactive to light. Right eye exhibits no discharge. Left eye exhibits no discharge. No scleral icterus.  Neck: Normal range of motion. Neck supple. No JVD present. No tracheal deviation present. No thyromegaly present.  Cardiovascular: Normal rate, regular rhythm, normal heart sounds and intact distal pulses.  Exam reveals no gallop and no friction rub.   No  murmur heard. Pulmonary/Chest: Effort normal and breath sounds normal. No stridor. No respiratory distress. He has no wheezes. He has no rales. He exhibits no tenderness.  Abdominal: Soft. Bowel sounds are normal. He exhibits no distension and no mass. There is tenderness. There is no rebound and no guarding.  Patient has tenderness to left mid and lower abdomen without rebound or guarding  Musculoskeletal: Normal range of motion. He exhibits no edema or tenderness.   Lymphadenopathy:    He has no cervical adenopathy.  Neurological: He is alert and oriented to person, place, and time. He displays normal reflexes. He exhibits normal muscle tone. Coordination normal.  Skin: Skin is warm and dry. No rash noted. No erythema. No pallor.  Psychiatric: He has a normal mood and affect. His behavior is normal. Judgment and thought content normal.  Nursing note and vitals reviewed.   ED Course  Procedures  DIAGNOSTIC STUDIES: Oxygen Saturation is 94% on RA, adequate by my interpretation.    COORDINATION OF CARE: 3:02 AM Discussed treatment plan which includes to order pain medication and GI symptom alleviating medication with pt. Will also order saline lock IV and lactic acid labs. Pt acknowledges and agrees to plan.   Labs Review Labs Reviewed - No data to display  Imaging Review Ct Abdomen Pelvis W Contrast  05/20/2015   CLINICAL DATA:  Generalized abdominal pain, nausea and vomiting today.  EXAM: CT ABDOMEN AND PELVIS WITH CONTRAST  TECHNIQUE: Multidetector CT imaging of the abdomen and pelvis was performed using the standard protocol following bolus administration of intravenous contrast.  CONTRAST:  25mL OMNIPAQUE IOHEXOL 300 MG/ML SOLN, OMNIPAQUE IOHEXOL 300 MG/ML SOLN  COMPARISON:  None.  FINDINGS: Lower chest: The lung bases are clear of acute process. Very small bilateral pleural effusions and minimal basilar atelectasis. The heart is normal in size. No pericardial effusion.  Hepatobiliary: Small scattered low-attenuation liver lesions are likely benign cysts. No worrisome hepatic lesions or intrahepatic biliary dilatation. The gallbladder is normal. No common bile duct dilatation.  Pancreas: There is fullness and mild inflammation involving the pancreatic tail suggesting pancreatitis. There are surrounding hyperplastic/inflamed lymph nodes. No pancreatic mass or ductal dilatation.  Spleen: Normal size.  No focal lesions.  Adrenals/Urinary Tract: The  adrenal glands are normal. Both kidneys are normal.  Stomach/Bowel: The stomach, duodenum, small bowel and colon are unremarkable. No inflammatory changes, mass lesions or obstructive findings. The terminal ileum is normal. The appendix is normal.  Vascular/Lymphatic: The aorta and branch vessels are normal. The major venous structures are patent. Small scattered retroperitoneal lymph nodes but no mass or adenopathy  Other: The bladder, prostate gland and seminal vesicles are unremarkable. No pelvic mass or adenopathy. No free pelvic fluid collections. No inguinal mass or adenopathy.  Musculoskeletal: No significant bony findings.  IMPRESSION: CT findings is suggestive of mild pancreatitis involving the pancreatic tail region.   Electronically Signed   By: Rudie Meyer M.D.   On: 05/20/2015 21:50   Dg Abd Acute W/chest  05/20/2015   CLINICAL DATA:  Emesis and abdominal pain for 1 day.  Hypertension.  EXAM: DG ABDOMEN ACUTE W/ 1V CHEST  COMPARISON:  Chest radiograph January 22, 2013  FINDINGS: PA chest: No edema or consolidation. Heart size and pulmonary vascularity are normal. No adenopathy. There is evidence of old trauma in the distal right clavicle region, stable.  Supine and upright abdomen: There is moderate stool in the colon. There is no bowel dilatation or air-fluid level suggesting obstruction. No  free air. There is a tiny phlebolith in left pelvis.  IMPRESSION: Bowel gas pattern unremarkable. No obstruction or free air. No lung edema or consolidation.   Electronically Signed   By: Bretta Bang III M.D.   On: 05/20/2015 20:23     EKG Interpretation None      MDM   Final diagnoses:  Other acute pancreatitis  Essential hypertension, benign    44 year old male seen earlier tonight admits her [place] with abdominal pain starting at lunch.  Patient with pancreatitis on CT scan, has history of same in the past.  I personally performed the services described in this documentation, which  was scribed in my presence. The recorded information has been reviewed and is accurate.  4:46 AM Patient reports no improvement in symptoms with medications.  Patient to receive Dilaudid.  Will discuss with hospitalist for admission for pancreatitis   Marisa Severin, MD 05/21/15 [phone]

## 2015-05-21 NOTE — Progress Notes (Signed)
Utilization Review completed. Vercie Pokorny RN BSN CM 

## 2015-05-21 NOTE — H&P (Signed)
[facility] History and Physical  Carol Loftin MVH:[id number] DOB: 09-16-1971 DOA: 05/21/2015  Referring physician: ED physician PCP: Laren Boom, DO  Specialists:   Chief Complaint: Nausea, vomiting, abdominal pain  HPI: Tyler Warren is a 44 y.o. male with PMH of alcohol abuse, alcoholic pancreatitis, hypertension, who presents with nausea, vomiting and abdominal pain.  Pt reports that he has nausea, vomiting and abdominal pain since yesterday. He vomited morning 10 times without blood in the vomitus. His abdominal pain is located in the left side of abdomen, constant, 9 out of 10 in severity, nonradiating. It is not aggravated or alleviated by any known factors. He reports that he drinks alcohol 3 times a week, 10 shots of liquor each time. Last drink was 3 days ago. He also reports having mild shortness of breath, but no cough, fever, chills, chest pain. Patient does not have symptoms of UTI, rashes, leg edema, unilateral weakness.  In ED, patient was found to have lipase 52, wbc 12.8, temperature normal, O2 saturation 96% on room air, no tachycardia, electrolytes okay. Acute abdomen/ chest x-ray is negative. CT abdomen showed mild pancreatitis involving the pancreatic tail region. Patient isn't immediately inpatient for further evaluation and treatment.  Where does patient live?   At home    Can patient participate in ADLs?  Yes   Review of Systems:   General: no fevers, chills, no changes in body weight, has fatigue HEENT: no blurry vision, hearing changes or sore throat Pulm: has dyspnea, no coughing, wheezing CV: no chest pain, palpitations Abd: has nausea, vomiting, abdominal pain, no diarrhea, constipation GU: no dysuria, burning on urination, increased urinary frequency, hematuria  Ext: no leg edema Neuro: no unilateral weakness, numbness, or tingling, no vision change or hearing loss Skin: no rash MSK: No muscle spasm, no deformity, no limitation of range of movement in  spin Heme: No easy bruising.  Travel history: No recent long distant travel.  Allergy: No Known Allergies  Past Medical History  Diagnosis Date  . Head trauma   . Hypertension   . Alcoholic pancreatitis   . Alcohol abuse     Past Surgical History  Procedure Laterality Date  . Tracheostomy      Social History:  reports that he has never smoked. He does not have any smokeless tobacco history on file. He reports that he drinks about 3.5 oz of alcohol per week. He reports that he does not use illicit drugs.  Family History: Patient was adopted.  Prior to Admission medications   Medication Sig Start Date End Date Taking? Authorizing Provider  lisinopril (PRINIVIL,ZESTRIL) 20 MG tablet ONE TABLET BY MOUTH DAILY FOR BLOOD PRESSURE CONTROL. 02/24/15  Yes Sean Hommel, DO  ondansetron (ZOFRAN) 4 MG tablet Take 1 tablet (4 mg total) by mouth every 6 (six) hours. 05/20/15   Jerelyn Scott, MD  oxyCODONE-acetaminophen (PERCOCET/ROXICET) 5-325 MG per tablet Take 1-2 tablets by mouth every 6 (six) hours as needed for severe pain. 05/20/15   Jerelyn Scott, MD    Physical Exam: Filed Vitals:   05/21/15 0403 05/21/15 0415 05/21/15 0430 05/21/15 0445  BP: 125/63 146/84 144/86 140/80  Pulse: 80 72 79 75  Temp:      TempSrc:      Resp: Height:      Weight:      SpO2: 96% 95% 94% 96%   General: Not in acute distress HEENT:       Eyes: PERRL, EOMI, no scleral icterus.  ENT: No discharge from the ears and nose, no pharynx injection, no tonsillar enlargement.        Neck: No JVD, no bruit, no mass felt. Heme: No neck lymph node enlargement. Cardiac: S1/S2, RRR, No murmurs, No gallops or rubs. Pulm:  No rales, wheezing, rhonchi or rubs. Abd: Soft, nondistended, tenderness over left side abdomen, no rebound pain, no organomegaly, BS present. Ext: No pitting leg edema bilaterally. 2+DP/PT pulse bilaterally. Musculoskeletal: No joint deformities, No joint redness or warmth, no  limitation of ROM in spin. Skin: No rashes.  Neuro: Alert, oriented X3, cranial nerves II-XII grossly intact, muscle strength 5/5 in all extremities, sensation to light touch intact.  Psych: Patient is not psychotic, no suicidal or hemocidal ideation.  Labs on Admission:  Basic Metabolic Panel:  Recent Labs Lab 05/20/15 1940  NA 139  K 4.1  CL 105  CO2 26  GLUCOSE 120*  BUN 17  CREATININE 0.95  CALCIUM 9.0   Liver Function Tests:  Recent Labs Lab 05/20/15 1940  AST 32  ALT 48  ALKPHOS 58  BILITOT 0.8  PROT 7.8  ALBUMIN 4.2    Recent Labs Lab 05/20/15 1940  LIPASE 52*   No results for input(s): AMMONIA in the last 168 hours. CBC:  Recent Labs Lab 05/20/15 1940  WBC 12.8*  HGB 15.3  HCT 43.3  MCV 90.2  PLT 190   Cardiac Enzymes: No results for input(s): CKTOTAL, CKMB, CKMBINDEX, TROPONINI in the last 168 hours.  BNP (last 3 results) No results for input(s): BNP in the last 8760 hours.  ProBNP (last 3 results) No results for input(s): PROBNP in the last 8760 hours.  CBG: No results for input(s): GLUCAP in the last 168 hours.  Radiological Exams on Admission: Ct Abdomen Pelvis W Contrast  05/20/2015   CLINICAL DATA:  Generalized abdominal pain, nausea and vomiting today.  EXAM: CT ABDOMEN AND PELVIS WITH CONTRAST  TECHNIQUE: Multidetector CT imaging of the abdomen and pelvis was performed using the standard protocol following bolus administration of intravenous contrast.  CONTRAST:  25mL OMNIPAQUE IOHEXOL 300 MG/ML SOLN, OMNIPAQUE IOHEXOL 300 MG/ML SOLN  COMPARISON:  None.  FINDINGS: Lower chest: The lung bases are clear of acute process. Very small bilateral pleural effusions and minimal basilar atelectasis. The heart is normal in size. No pericardial effusion.  Hepatobiliary: Small scattered low-attenuation liver lesions are likely benign cysts. No worrisome hepatic lesions or intrahepatic biliary dilatation. The gallbladder is normal. No common bile  duct dilatation.  Pancreas: There is fullness and mild inflammation involving the pancreatic tail suggesting pancreatitis. There are surrounding hyperplastic/inflamed lymph nodes. No pancreatic mass or ductal dilatation.  Spleen: Normal size.  No focal lesions.  Adrenals/Urinary Tract: The adrenal glands are normal. Both kidneys are normal.  Stomach/Bowel: The stomach, duodenum, small bowel and colon are unremarkable. No inflammatory changes, mass lesions or obstructive findings. The terminal ileum is normal. The appendix is normal.  Vascular/Lymphatic: The aorta and branch vessels are normal. The major venous structures are patent. Small scattered retroperitoneal lymph nodes but no mass or adenopathy  Other: The bladder, prostate gland and seminal vesicles are unremarkable. No pelvic mass or adenopathy. No free pelvic fluid collections. No inguinal mass or adenopathy.  Musculoskeletal: No significant bony findings.  IMPRESSION: CT findings is suggestive of mild pancreatitis involving the pancreatic tail region.   Electronically Signed   By: Rudie Meyer M.D.   On: 05/20/2015 21:50   Dg Abd Acute W/chest  05/20/2015  CLINICAL DATA:  Emesis and abdominal pain for 1 day.  Hypertension.  EXAM: DG ABDOMEN ACUTE W/ 1V CHEST  COMPARISON:  Chest radiograph January 22, 2013  FINDINGS: PA chest: No edema or consolidation. Heart size and pulmonary vascularity are normal. No adenopathy. There is evidence of old trauma in the distal right clavicle region, stable.  Supine and upright abdomen: There is moderate stool in the colon. There is no bowel dilatation or air-fluid level suggesting obstruction. No free air. There is a tiny phlebolith in left pelvis.  IMPRESSION: Bowel gas pattern unremarkable. No obstruction or free air. No lung edema or consolidation.   Electronically Signed   By: Bretta Bang III M.D.   On: 05/20/2015 20:23    EKG: Not done in ED, will get one.   Assessment/Plan Principal Problem:    Recurrent acute pancreatitis Active Problems:   Essential hypertension, benign   Alcohol abuse   SOB (shortness of breath)  Recurrent acute pancreatitis: most likely due to alcochol abuse. CT scan showed acute pancreatitis, no complications. Patient does not have SIRS, hemodynamically stable.  -will admit to med-surg bed for observation -NPO for pancreatitis -IVF: NS 125 cc/hr -IV morphine and oral percocet for pain control, IV zofran for nausea -IV protonix for possible alcoholic gastritis -abdominal ultrasound to re-evaluate pancreas, gallbladder, and bile ducts  -check lipid profile to rule out hyper triglyceridemia  HTN: -continue lisinopril  Alcohol abuse -Did counseling about the importance of quitting drinking -CIWA protocol  SOB (shortness of breath): Etiology is not clear. X-ray of acute abdomen/chest is negative. No chest pain and signs of DVT, less likely to have pulmonary embolism. May be related to the hypo-ventilation due to abdominal pain. -Albuterol nebs prn -nasal cannular oxgen   DVT ppx: SQ Heparin   Code Status: Full code Family Communication: Yes, patient's  wife  at bed side Disposition Plan: Admit to inpatient   Date of Service 05/21/2015    Lorretta Harp [facility] Pager [phone]  If 7PM-7AM, please contact night-coverage [email or web address] Password [email or web address] 05/21/2015, 5:20 AM

## 2015-05-21 NOTE — Progress Notes (Signed)
Patient admitted earlier this morning. H&P reviewed. Patient seen and examined  S: Patient complains of 7 out of 10 abdominal pain. Denies any nausea, vomiting. No bowel movements. Passing gas.  O: Vital signs reviewed Lungs are clear to auscultation bilaterally. S1. S2 is normal regular. No S3, S4, no rubs, murmurs, or bruit. Abdomen is soft, tender in the epigastric area without any rebound, rigidity or guarding. No masses or organomegaly. Bowel sounds are present. Alert and oriented 3. No focal neurological deficits are noted.  Lipase was 52. Triglycerides 129. CT scan report reviewed. Lactic acid level normal.  A/P: Patient admitted with abdominal pain thought to be secondary to acute alcoholic pancreatitis. Consistent with findings noted on CT scan. Continue nothing by mouth. Patient pain is not adequately controlled. We will change his pain medications. Patient counseled to stop drinking alcohol. Monitor blood pressures. We will hold his lisinopril since that can also cause pancreatitis. Hydralazine as needed.  Continue to monitor.  Clista Rainford 2:03 PM

## 2015-05-21 NOTE — Plan of Care (Signed)
Problem: Phase I Progression Outcomes Goal: OOB as tolerated unless otherwise ordered Outcome: Completed/Met Date Met:  05/21/15 OOB walking in halls Goal: Initial discharge plan identified Outcome: Completed/Met Date Met:  05/21/15 To return home with wife

## 2015-05-21 NOTE — ED Notes (Signed)
EDP at bedside  

## 2015-05-21 NOTE — Care Management Note (Signed)
Case Management Note  Patient Details  Name: Tyler Warren MRN: [id number] Date of Birth: Aug 10, 1971  Subjective/Objective:                 Patient from home with spouse; self care. ETOH pancreatitis as confirmed by imaging. Patient denies any barriers to obtaining care or follow up.    Action/Plan:  No CM needs identified at this time. Will continue to monitor.  Expected Discharge Date:                  Expected Discharge Plan:  Home/Self Care  In-House Referral:     Discharge planning Services  CM Consult  Post Acute Care Choice:    Choice offered to:     DME Arranged:    DME Agency:     HH Arranged:    HH Agency:     Status of Service:  In process, will continue to follow  Medicare Important Message Given:    Date Medicare IM Given:    Medicare IM give by:    Date Additional Medicare IM Given:    Additional Medicare Important Message give by:     If discussed at Long Length of Stay Meetings, dates discussed:    Additional Comments:  Lawerance Sabal, RN 05/21/2015, 2:28 PM

## 2015-05-22 ENCOUNTER — Inpatient Hospital Stay (HOSPITAL_COMMUNITY): Payer: Managed Care, Other (non HMO)

## 2015-05-22 DIAGNOSIS — F1010 Alcohol abuse, uncomplicated: Secondary | ICD-10-CM

## 2015-05-22 DIAGNOSIS — K859 Acute pancreatitis, unspecified: Secondary | ICD-10-CM

## 2015-05-22 DIAGNOSIS — I10 Essential (primary) hypertension: Secondary | ICD-10-CM

## 2015-05-22 DIAGNOSIS — K858 Other acute pancreatitis without necrosis or infection: Secondary | ICD-10-CM | POA: Insufficient documentation

## 2015-05-22 LAB — LIPASE, BLOOD: Lipase: 77 U/L — ABNORMAL HIGH (ref 22–51)

## 2015-05-22 LAB — CBC
HCT: 40.5 % (ref 39.0–52.0)
Hemoglobin: 14.2 g/dL (ref 13.0–17.0)
MCH: 31.8 pg (ref 26.0–34.0)
MCHC: 35.1 g/dL (ref 30.0–36.0)
MCV: 90.8 fL (ref 78.0–100.0)
PLATELETS: 164 10*3/uL (ref 150–400)
RBC: 4.46 MIL/uL (ref 4.22–5.81)
RDW: 12.2 % (ref 11.5–15.5)
WBC: 17.4 10*3/uL — AB (ref 4.0–10.5)

## 2015-05-22 LAB — COMPREHENSIVE METABOLIC PANEL
ALBUMIN: 3.2 g/dL — AB (ref 3.5–5.0)
ALT: 28 U/L (ref 17–63)
ANION GAP: 7 (ref 5–15)
AST: 17 U/L (ref 15–41)
Alkaline Phosphatase: 57 U/L (ref 38–126)
BILIRUBIN TOTAL: 2.3 mg/dL — AB (ref 0.3–1.2)
BUN: 7 mg/dL (ref 6–20)
CALCIUM: 8.2 mg/dL — AB (ref 8.9–10.3)
CO2: 21 mmol/L — ABNORMAL LOW (ref 22–32)
CREATININE: 0.92 mg/dL (ref 0.61–1.24)
Chloride: 106 mmol/L (ref 101–111)
GLUCOSE: 110 mg/dL — AB (ref 65–99)
Potassium: 3.8 mmol/L (ref 3.5–5.1)
SODIUM: 134 mmol/L — AB (ref 135–145)
Total Protein: 6.2 g/dL — ABNORMAL LOW (ref 6.5–8.1)

## 2015-05-22 LAB — LACTIC ACID, PLASMA: Lactic Acid, Venous: 1 mmol/L (ref 0.5–2.0)

## 2015-05-22 MED ORDER — CHLORHEXIDINE GLUCONATE 0.12% ORAL RINSE (MEDLINE KIT)
15.0000 mL | Freq: Two times a day (BID) | OROMUCOSAL | Status: DC
  Administered 2015-05-23 – 2015-05-24 (×3): 15 mL via OROMUCOSAL

## 2015-05-22 MED ORDER — ACETAMINOPHEN 650 MG RE SUPP
650.0000 mg | Freq: Four times a day (QID) | RECTAL | Status: DC | PRN
  Administered 2015-05-22: 650 mg via RECTAL
  Filled 2015-05-22: qty 1

## 2015-05-22 MED ORDER — SODIUM CHLORIDE 0.9 % IV BOLUS (SEPSIS)
500.0000 mL | Freq: Once | INTRAVENOUS | Status: AC
  Administered 2015-05-22: 500 mL via INTRAVENOUS

## 2015-05-22 MED ORDER — ACETAMINOPHEN 325 MG PO TABS: 650.0000 mg | ORAL_TABLET | Freq: Four times a day (QID) | ORAL | Status: DC | PRN

## 2015-05-22 MED ORDER — CETYLPYRIDINIUM CHLORIDE 0.05 % MT LIQD
7.0000 mL | Freq: Two times a day (BID) | OROMUCOSAL | Status: DC
  Administered 2015-05-23 – 2015-05-24 (×3): 7 mL via OROMUCOSAL

## 2015-05-22 NOTE — Progress Notes (Signed)
PROGRESS NOTE  Tyler Warren ZOX:[id number] DOB: 28-Nov-1970 DOA: 05/21/2015 PCP: Laren Boom, DO  Brief history 44 y.o. male with PMH of alcohol abuse, alcoholic pancreatitis, hypertension, who presents with one day hx of nausea, vomiting and abdominal pain. Patient stated that he vomited 10 times on the day of admission. He also complained of left-sided abdominal pain without any alleviating or aggravating factors. The patient drinks 3 times per week 10 shots of liquor each time with beer x 10 years. Last drink was 3 days prior to admission. Assessment/Plan: Recurrent acute pancreatitis:  -most likely due to alcochol abuse.  -CT scan showed mild inflammation at the tail of the pancreas -NPO except meds  -IVF: NS 125 cc/hr -IV hydromorphone  IV zofran for nausea -IV protonix for possible alcoholic gastritis -abdominal ultrasound --negative gallbladder wall thickening or ductal dilatation  Increasing abdominal pain and distention -Suspect patient is developing ileus from his opiates -Repeat acute abdominal series -If acute abdominal series is negative, try bisacodyl supp  Fever/Leukocytosis -likely due to pancreatitis and ileus -blood cultures x 2  -UA neg for pyuria  HTN: -d/c  Lisinopril as this may also contribute to pancreatitis  Alcohol abuse -counseling about the importance of quitting drinking -CIWA protocol  SOB (shortness of breath):  -secondary to hypoventilation from abdominal pain   Family Communication:   Wife updated at beside Disposition Plan:   Home in 2-3 days if stable     Procedures/Studies: US Abdomen Complete  05/21/2015   CLINICAL DATA:  Recurrent alcoholic pancreatitis  EXAM: ULTRASOUND ABDOMEN COMPLETE  COMPARISON:  CT 05/20/2015  FINDINGS: Gallbladder: No gallstones or wall thickening visualized. No sonographic Murphy sign noted.  Common bile duct: Diameter: 4 mm  Liver: Hepatic cysts are reidentified. No solid focal lesion. No intrahepatic  ductal dilatation.  IVC: No abnormality visualized.  Pancreas: Obscured by bowel gas  Spleen: Size and appearance within normal limits.  Right Kidney: Length: 11.6 cm. Echogenicity within normal limits. No mass or hydronephrosis visualized.  Left Kidney: Length: 11.2 cm. Echogenicity within normal limits. No mass or hydronephrosis visualized.  Abdominal aorta: No aneurysm visualized.  Other findings: None.  IMPRESSION: Normal exam.   Electronically Signed   By: Christiana Pellant M.D.   On: 05/21/2015 08:20   Ct Abdomen Pelvis W Contrast  05/20/2015   CLINICAL DATA:  Generalized abdominal pain, nausea and vomiting today.  EXAM: CT ABDOMEN AND PELVIS WITH CONTRAST  TECHNIQUE: Multidetector CT imaging of the abdomen and pelvis was performed using the standard protocol following bolus administration of intravenous contrast.  CONTRAST:  25mL OMNIPAQUE IOHEXOL 300 MG/ML SOLN, OMNIPAQUE IOHEXOL 300 MG/ML SOLN  COMPARISON:  None.  FINDINGS: Lower chest: The lung bases are clear of acute process. Very small bilateral pleural effusions and minimal basilar atelectasis. The heart is normal in size. No pericardial effusion.  Hepatobiliary: Small scattered low-attenuation liver lesions are likely benign cysts. No worrisome hepatic lesions or intrahepatic biliary dilatation. The gallbladder is normal. No common bile duct dilatation.  Pancreas: There is fullness and mild inflammation involving the pancreatic tail suggesting pancreatitis. There are surrounding hyperplastic/inflamed lymph nodes. No pancreatic mass or ductal dilatation.  Spleen: Normal size.  No focal lesions.  Adrenals/Urinary Tract: The adrenal glands are normal. Both kidneys are normal.  Stomach/Bowel: The stomach, duodenum, small bowel and colon are unremarkable. No inflammatory changes, mass lesions or obstructive findings. The terminal ileum is normal. The appendix is normal.  Vascular/Lymphatic:  The aorta and branch vessels are normal. The major venous  structures are patent. Small scattered retroperitoneal lymph nodes but no mass or adenopathy  Other: The bladder, prostate gland and seminal vesicles are unremarkable. No pelvic mass or adenopathy. No free pelvic fluid collections. No inguinal mass or adenopathy.  Musculoskeletal: No significant bony findings.  IMPRESSION: CT findings is suggestive of mild pancreatitis involving the pancreatic tail region.   Electronically Signed   By: Rudie Meyer M.D.   On: 05/20/2015 21:50   Dg Abd Acute W/chest  05/20/2015   CLINICAL DATA:  Emesis and abdominal pain for 1 day.  Hypertension.  EXAM: DG ABDOMEN ACUTE W/ 1V CHEST  COMPARISON:  Chest radiograph January 22, 2013  FINDINGS: PA chest: No edema or consolidation. Heart size and pulmonary vascularity are normal. No adenopathy. There is evidence of old trauma in the distal right clavicle region, stable.  Supine and upright abdomen: There is moderate stool in the colon. There is no bowel dilatation or air-fluid level suggesting obstruction. No free air. There is a tiny phlebolith in left pelvis.  IMPRESSION: Bowel gas pattern unremarkable. No obstruction or free air. No lung edema or consolidation.   Electronically Signed   By: Bretta Bang III M.D.   On: 05/20/2015 20:23         Subjective: Patient states that his nausea and vomiting have improved. History of having abdominal pain. He noted some increasing abdominal distention today. He is not passing flatus nor has he had a bowel movement since 05/19/2015. He denies any dysuria, hematuria, chest pain, shortness breath, hematemesis. There is no hematochezia or melena. He did have fevers today.  Objective: Filed Vitals:   05/22/15 0802 05/22/15 1209 05/22/15 1430 05/22/15 1713  BP: 135/79 128/76    Pulse: 96 109  92  Temp: 98.4 F (36.9 C) 100.1 F (37.8 C) 99.9 F (37.7 C)   TempSrc: Oral Oral Oral   Resp: 24     Height:      Weight:      SpO2: 94% 94%      Intake/Output Summary (Last 24  hours) at 05/22/15 1837 Last data filed at 05/22/15 1714  Gross per 24 hour  Intake 2738.34 ml  Output    600 ml  Net 2138.34 ml   Weight change:  Exam:   General:  Pt is alert, follows commands appropriately, not in acute distress  HEENT: No icterus, No thrush, No neck mass, [place]/AT  Cardiovascular: RRR, S1/S2, no rubs, no gallops  Respiratory: Bibasilar crackles. No wheezing. Good air movement.  Abdomen: Soft/+BS, LUQ, LLQ tender, mildly distended, no guarding; no hepatosplenomegaly  Extremities: No edema, No lymphangitis, No petechiae, No rashes, no synovitis no cyanosis or clubbing  Data Reviewed: Basic Metabolic Panel:  Recent Labs Lab 05/20/15 1940 05/22/15 0224  NA 139 134*  K 4.1 3.8  CL 105 106  CO2 26 21*  GLUCOSE 120* 110*  BUN 17 7  CREATININE 0.95 0.92  CALCIUM 9.0 8.2*   Liver Function Tests:  Recent Labs Lab 05/20/15 1940 05/22/15 0224  AST 32 17  ALT 48 28  ALKPHOS 58 57  BILITOT 0.8 2.3*  PROT 7.8 6.2*  ALBUMIN 4.2 3.2*    Recent Labs Lab 05/20/15 1940 05/22/15 0224  LIPASE 52* 77*   No results for input(s): AMMONIA in the last 168 hours. CBC:  Recent Labs Lab 05/20/15 1940 05/22/15 0224  WBC 12.8* 17.4*  HGB 15.3 14.2  HCT 43.3 40.5  MCV 90.2 90.8  PLT 190 164   Cardiac Enzymes: No results for input(s): CKTOTAL, CKMB, CKMBINDEX, TROPONINI in the last 168 hours. BNP: Invalid input(s): POCBNP CBG: No results for input(s): GLUCAP in the last 168 hours.  No results found for this or any previous visit (from the past 240 hour(s)).   Scheduled Meds: . antiseptic oral rinse  7 mL Mouth Rinse q12n4p  . chlorhexidine gluconate  15 mL Mouth Rinse BID  . folic acid  1 mg Oral Daily  . heparin  5,000 Units Subcutaneous 3 times per day  . LORazepam  0-4 mg Intravenous Q6H   Followed by  . [START ON 05/23/2015] LORazepam  0-4 mg Intravenous Q12H  . multivitamin with minerals  1 tablet Oral Daily  . pantoprazole  40 mg Oral  Q1200  . thiamine  100 mg Oral Daily   Or  . thiamine  100 mg Intravenous Daily   Continuous Infusions: . sodium chloride 100 mL/hr at 05/22/15 1110     Manila Rommel, DO  [facility] Pager [phone]  If 7PM-7AM, please contact night-coverage [email or web address] Password [email or web address] 05/22/2015, 6:37 PM   LOS: 1 day

## 2015-05-23 DIAGNOSIS — J181 Lobar pneumonia, unspecified organism: Secondary | ICD-10-CM

## 2015-05-23 LAB — CBC
HCT: 36.4 % — ABNORMAL LOW (ref 39.0–52.0)
HEMOGLOBIN: 12.6 g/dL — AB (ref 13.0–17.0)
MCH: 31.6 pg (ref 26.0–34.0)
MCHC: 34.6 g/dL (ref 30.0–36.0)
MCV: 91.2 fL (ref 78.0–100.0)
PLATELETS: 160 10*3/uL (ref 150–400)
RBC: 3.99 MIL/uL — ABNORMAL LOW (ref 4.22–5.81)
RDW: 12.3 % (ref 11.5–15.5)
WBC: 15.6 10*3/uL — AB (ref 4.0–10.5)

## 2015-05-23 LAB — COMPREHENSIVE METABOLIC PANEL
ALK PHOS: 53 U/L (ref 38–126)
ALT: 20 U/L (ref 17–63)
ANION GAP: 5 (ref 5–15)
AST: 14 U/L — ABNORMAL LOW (ref 15–41)
Albumin: 2.6 g/dL — ABNORMAL LOW (ref 3.5–5.0)
BUN: 11 mg/dL (ref 6–20)
CO2: 22 mmol/L (ref 22–32)
Calcium: 8.1 mg/dL — ABNORMAL LOW (ref 8.9–10.3)
Chloride: 107 mmol/L (ref 101–111)
Creatinine, Ser: 0.97 mg/dL (ref 0.61–1.24)
GFR calc Af Amer: >60 mL/min (ref >60)
GFR calc non Af Amer: >60 mL/min (ref >60)
GLUCOSE: 98 mg/dL (ref 65–99)
Potassium: 3.9 mmol/L (ref 3.5–5.1)
SODIUM: 134 mmol/L — AB (ref 135–145)
Total Bilirubin: 2 mg/dL — ABNORMAL HIGH (ref 0.3–1.2)
Total Protein: 6 g/dL — ABNORMAL LOW (ref 6.5–8.1)

## 2015-05-23 LAB — LACTIC ACID, PLASMA: LACTIC ACID, VENOUS: 0.7 mmol/L (ref 0.5–2.0)

## 2015-05-23 MED ORDER — DOCUSATE SODIUM 100 MG PO CAPS
100.0000 mg | ORAL_CAPSULE | Freq: Two times a day (BID) | ORAL | Status: DC
  Administered 2015-05-23 – 2015-05-25 (×4): 100 mg via ORAL
  Filled 2015-05-23 (×4): qty 1

## 2015-05-23 MED ORDER — BISACODYL 10 MG RE SUPP
10.0000 mg | Freq: Once | RECTAL | Status: AC
  Administered 2015-05-23: 10 mg via RECTAL
  Filled 2015-05-23: qty 1

## 2015-05-23 MED ORDER — SENNA 8.6 MG PO TABS
2.0000 | ORAL_TABLET | Freq: Every day | ORAL | Status: DC
  Administered 2015-05-23: 8.6 mg via ORAL
  Administered 2015-05-25: 17.2 mg via ORAL
  Filled 2015-05-23 (×2): qty 2

## 2015-05-23 MED ORDER — LEVOFLOXACIN IN D5W 750 MG/150ML IV SOLN
750.0000 mg | INTRAVENOUS | Status: AC
  Administered 2015-05-23 – 2015-05-24 (×2): 750 mg via INTRAVENOUS
  Filled 2015-05-23 (×2): qty 150

## 2015-05-23 MED ORDER — LACTULOSE 10 GM/15ML PO SOLN
20.0000 g | Freq: Once | ORAL | Status: AC
Start: 2015-05-23 — End: 2015-05-23
  Administered 2015-05-23: 20 g via ORAL
  Filled 2015-05-23: qty 30

## 2015-05-23 NOTE — Progress Notes (Signed)
PROGRESS NOTE  Tyler Warren WUJ:[id number] DOB: October 03, 1971 DOA: 05/21/2015 PCP: Laren Boom, DO  Brief history 43 y.o. male with PMH of alcohol abuse, alcoholic pancreatitis, hypertension, who presents with one day hx of nausea, vomiting and abdominal pain. Patient stated that he vomited 10 times on the day of admission. He also complained of left-sided abdominal pain without any alleviating or aggravating factors. The patient drinks 3 times per week 10 shots of liquor each time with beer x 10 years. Last drink was 3 days prior to admission. Assessment/Plan: Recurrent acute pancreatitis:  -most likely due to alcochol abuse.  -CT scan showed mild inflammation at the tail of the pancreas -NPO except meds  -IVF: NS 125 cc/hr -IV hydromorphone IV zofran for nausea -IV protonix for possible alcoholic gastritis -abdominal ultrasound --negative gallbladder wall thickening or ductal dilatation  Increasing abdominal pain and distention -Suspect patient is developing ileus from his opiates -8/4 Repeat acute abdominal series--new left lobe consolidation, nonobstructive bowel gas, gas and stool scattered throughout colon -LUQ pain multifactorial including constipation, PNA, pancreatitis -start colace and bisacodyl supp-->add lactulose and senna  Fever/Leukocytosis -due to pancreatitis and CAP -improving -blood cultures x 2--neg to date -UA neg for pyuria  CAP -infiltrate developed after fluid hydration -IV levofloxacin  HTN: -d/c Lisinopril as this may also contribute to pancreatitis -BP remains acceptable  Alcohol abuse -counseling about the importance of quitting drinking -CIWA protocol  SOB (shortness of breath):  -secondary to hypoventilation from abdominal pain and PNA   Family Communication: Wife updated at beside Disposition Plan: Home in 2-3 days if stable   Procedures/Studies: US Abdomen Complete  05/21/2015   CLINICAL DATA:  Recurrent alcoholic  pancreatitis  EXAM: ULTRASOUND ABDOMEN COMPLETE  COMPARISON:  CT 05/20/2015  FINDINGS: Gallbladder: No gallstones or wall thickening visualized. No sonographic Murphy sign noted.  Common bile duct: Diameter: 4 mm  Liver: Hepatic cysts are reidentified. No solid focal lesion. No intrahepatic ductal dilatation.  IVC: No abnormality visualized.  Pancreas: Obscured by bowel gas  Spleen: Size and appearance within normal limits.  Right Kidney: Length: 11.6 cm. Echogenicity within normal limits. No mass or hydronephrosis visualized.  Left Kidney: Length: 11.2 cm. Echogenicity within normal limits. No mass or hydronephrosis visualized.  Abdominal aorta: No aneurysm visualized.  Other findings: None.  IMPRESSION: Normal exam.   Electronically Signed   By: Christiana Pellant M.D.   On: 05/21/2015 08:20   Ct Abdomen Pelvis W Contrast  05/20/2015   CLINICAL DATA:  Generalized abdominal pain, nausea and vomiting today.  EXAM: CT ABDOMEN AND PELVIS WITH CONTRAST  TECHNIQUE: Multidetector CT imaging of the abdomen and pelvis was performed using the standard protocol following bolus administration of intravenous contrast.  CONTRAST:  25mL OMNIPAQUE IOHEXOL 300 MG/ML SOLN, OMNIPAQUE IOHEXOL 300 MG/ML SOLN  COMPARISON:  None.  FINDINGS: Lower chest: The lung bases are clear of acute process. Very small bilateral pleural effusions and minimal basilar atelectasis. The heart is normal in size. No pericardial effusion.  Hepatobiliary: Small scattered low-attenuation liver lesions are likely benign cysts. No worrisome hepatic lesions or intrahepatic biliary dilatation. The gallbladder is normal. No common bile duct dilatation.  Pancreas: There is fullness and mild inflammation involving the pancreatic tail suggesting pancreatitis. There are surrounding hyperplastic/inflamed lymph nodes. No pancreatic mass or ductal dilatation.  Spleen: Normal size.  No focal lesions.  Adrenals/Urinary Tract: The adrenal glands are normal. Both  kidneys are normal.  Stomach/Bowel: The  stomach, duodenum, small bowel and colon are unremarkable. No inflammatory changes, mass lesions or obstructive findings. The terminal ileum is normal. The appendix is normal.  Vascular/Lymphatic: The aorta and branch vessels are normal. The major venous structures are patent. Small scattered retroperitoneal lymph nodes but no mass or adenopathy  Other: The bladder, prostate gland and seminal vesicles are unremarkable. No pelvic mass or adenopathy. No free pelvic fluid collections. No inguinal mass or adenopathy.  Musculoskeletal: No significant bony findings.  IMPRESSION: CT findings is suggestive of mild pancreatitis involving the pancreatic tail region.   Electronically Signed   By: Rudie Meyer M.D.   On: 05/20/2015 21:50   Dg Abd Acute W/chest  05/22/2015   CLINICAL DATA:  44 year old male with excruciating left lateral abdominal pain. Fever.  EXAM: DG ABDOMEN ACUTE W/ 1V CHEST  COMPARISON:  Acute abdominal series 05/20/2015.  FINDINGS: New area of airspace consolidation in the left lower lobe. Small left parapneumonic pleural effusion. Right lung is clear. No evidence of pulmonary edema. Heart size is upper limits of normal. The patient is rotated to the right on today's exam, resulting in distortion of the mediastinal contours and reduced diagnostic sensitivity and specificity for mediastinal pathology.  Gas and stool are seen scattered throughout the colon extending to the level of the distal rectum. Oral contrast material is also noted in the colon, predominantly in the cecum and ascending colon, presumably related to recent CT scan performed on 05/20/2015. No pathologic distension of small bowel is noted. No gross evidence of pneumoperitoneum.  IMPRESSION: 1. Nonobstructive bowel gas pattern. 2. No pneumoperitoneum. 3. New left lower lobe airspace consolidation concerning for pneumonia or sequela of recent aspiration, with small left parapneumonic pleural  effusion.   Electronically Signed   By: Trudie Reed M.D.   On: 05/22/2015 19:26   Dg Abd Acute W/chest  05/20/2015   CLINICAL DATA:  Emesis and abdominal pain for 1 day.  Hypertension.  EXAM: DG ABDOMEN ACUTE W/ 1V CHEST  COMPARISON:  Chest radiograph January 22, 2013  FINDINGS: PA chest: No edema or consolidation. Heart size and pulmonary vascularity are normal. No adenopathy. There is evidence of old trauma in the distal right clavicle region, stable.  Supine and upright abdomen: There is moderate stool in the colon. There is no bowel dilatation or air-fluid level suggesting obstruction. No free air. There is a tiny phlebolith in left pelvis.  IMPRESSION: Bowel gas pattern unremarkable. No obstruction or free air. No lung edema or consolidation.   Electronically Signed   By: Bretta Bang III M.D.   On: 05/20/2015 20:23         Subjective: Patient complains of abdominal pain not much worse than yesterday but feels constipated. Denies any fever, chills, chest pain, cough, hemoptysis. He does have some shortness of breath. Denies any headache, neck pain, dysuria, hematuria, hematochezia, melena.  Objective: Filed Vitals:   05/22/15 1713 05/23/15 0009 05/23/15 0547 05/23/15 1316  BP:  153/88 139/79 131/88  Pulse: 92 100 96 89  Temp:  99.7 F (37.6 C) 99.3 F (37.4 C) 98.2 F (36.8 C)  TempSrc:  Oral Oral Oral  Resp:  18 18 22   Height:      Weight:      SpO2:  93% 93% 94%    Intake/Output Summary (Last 24 hours) at 05/23/15 1851 Last data filed at 05/23/15 1818  Gross per 24 hour  Intake 2726.67 ml  Output      0 ml  Net  2726.67 ml   Weight change:  Exam:   General:  Pt is alert, follows commands appropriately, not in acute distress  HEENT: No icterus, No thrush, No neck mass, [place]/AT  Cardiovascular: RRR, S1/S2, no rubs, no gallops  Respiratory: Left basilar crackles. No wheezing.  Abdomen: Soft/+BS, non tender, mildly distended, no guarding; no  hepatosplenomegaly  Extremities: No edema, No lymphangitis, No petechiae, No rashes, no synovitis  Data Reviewed: Basic Metabolic Panel:  Recent Labs Lab 05/20/15 1940 05/22/15 0224 05/23/15 0600  NA 139 134* 134*  K 4.1 3.8 3.9  CL 105 106 107  CO2 26 21* 22  GLUCOSE 120* 110* 98  BUN CREATININE 0.95 0.92 0.97  CALCIUM 9.0 8.2* 8.1*   Liver Function Tests:  Recent Labs Lab 05/20/15 1940 05/22/15 0224 05/23/15 0600  AST 32 17 14*  ALT 48 28 20  ALKPHOS 58 57 53  BILITOT 0.8 2.3* 2.0*  PROT 7.8 6.2* 6.0*  ALBUMIN 4.2 3.2* 2.6*    Recent Labs Lab 05/20/15 1940 05/22/15 0224  LIPASE 52* 77*   No results for input(s): AMMONIA in the last 168 hours. CBC:  Recent Labs Lab 05/20/15 1940 05/22/15 0224 05/23/15 0600  WBC 12.8* 17.4* 15.6*  HGB 15.3 14.2 12.6*  HCT 43.3 40.5 36.4*  MCV 90.2 90.8 91.2  PLT 190 164 160   Cardiac Enzymes: No results for input(s): CKTOTAL, CKMB, CKMBINDEX, TROPONINI in the last 168 hours. BNP: Invalid input(s): POCBNP CBG: No results for input(s): GLUCAP in the last 168 hours.  Recent Results (from the past 240 hour(s))  Culture, blood (routine x 2)     Status: None (Preliminary result)   Collection Time: 05/22/15  2:24 AM  Result Value Ref Range Status   Specimen Description BLOOD LEFT ANTECUBITAL  Final   Special Requests BOTTLES DRAWN AEROBIC AND ANAEROBIC 10CC,6CC  Final   Culture NO GROWTH 1 DAY  Final   Report Status PENDING  Incomplete  Culture, blood (routine x 2)     Status: None (Preliminary result)   Collection Time: 05/22/15  2:34 AM  Result Value Ref Range Status   Specimen Description BLOOD LEFT HAND  Final   Special Requests BOTTLES DRAWN AEROBIC ONLY 10CC  Final   Culture NO GROWTH 1 DAY  Final   Report Status PENDING  Incomplete     Scheduled Meds: . antiseptic oral rinse  7 mL Mouth Rinse q12n4p  . chlorhexidine gluconate  15 mL Mouth Rinse BID  . docusate sodium  100 mg Oral BID  .  folic acid  1 mg Oral Daily  . heparin  5,000 Units Subcutaneous 3 times per day  . levofloxacin (LEVAQUIN) IV  750 mg Intravenous Q24H  . LORazepam  0-4 mg Intravenous Q12H  . multivitamin with minerals  1 tablet Oral Daily  . pantoprazole  40 mg Oral Q1200  . thiamine  100 mg Oral Daily   Or  . thiamine  100 mg Intravenous Daily   Continuous Infusions: . sodium chloride 1,000 mL (05/23/15 1532)     Levi Klaiber, DO  [facility] Pager [phone]  If 7PM-7AM, please contact night-coverage [email or web address] Password [email or web address] 05/23/2015, 6:51 PM   LOS: 2 days

## 2015-05-24 LAB — BASIC METABOLIC PANEL
Anion gap: 9 (ref 5–15)
BUN: 12 mg/dL (ref 6–20)
CO2: 20 mmol/L — AB (ref 22–32)
Calcium: 7.9 mg/dL — ABNORMAL LOW (ref 8.9–10.3)
Chloride: 106 mmol/L (ref 101–111)
Creatinine, Ser: 0.88 mg/dL (ref 0.61–1.24)
GFR calc Af Amer: >60 mL/min (ref >60)
Glucose, Bld: 85 mg/dL (ref 65–99)
Potassium: 4 mmol/L (ref 3.5–5.1)
Sodium: 135 mmol/L (ref 135–145)

## 2015-05-24 LAB — CBC
HCT: 35 % — ABNORMAL LOW (ref 39.0–52.0)
Hemoglobin: 12.1 g/dL — ABNORMAL LOW (ref 13.0–17.0)
MCH: 31.3 pg (ref 26.0–34.0)
MCHC: 34.6 g/dL (ref 30.0–36.0)
MCV: 90.7 fL (ref 78.0–100.0)
Platelets: 183 10*3/uL (ref 150–400)
RBC: 3.86 MIL/uL — ABNORMAL LOW (ref 4.22–5.81)
RDW: 12.3 % (ref 11.5–15.5)
WBC: 12.8 10*3/uL — ABNORMAL HIGH (ref 4.0–10.5)

## 2015-05-24 MED ORDER — LEVOFLOXACIN 750 MG PO TABS
750.0000 mg | ORAL_TABLET | Freq: Every morning | ORAL | Status: DC
  Administered 2015-05-25: 750 mg via ORAL
  Filled 2015-05-24: qty 1

## 2015-05-24 NOTE — Progress Notes (Signed)
PROGRESS NOTE  Tyler Warren ZOX:[id number] DOB: June 30, 1971 DOA: 05/21/2015 PCP: Laren Boom, DO  Brief history 44 y.o. male with PMH of alcohol abuse, alcoholic pancreatitis, hypertension, who presents with one day hx of nausea, vomiting and abdominal pain. Patient stated that he vomited 10 times on the day of admission. He also complained of left-sided abdominal pain without any alleviating or aggravating factors. The patient drinks 3 times per week 10 shots of liquor each time with beer x 10 years. Last drink was 3 days prior to admission. Assessment/Plan: Recurrent acute pancreatitis:  -most likely due to alcochol abuse.  -CT scan showed mild inflammation at the tail of the pancreas -Advance to full liquid diet -IVF: NS 125 cc/hr -IV hydromorphone IV zofran for nausea -po protonix for possible alcoholic gastritis -abdominal ultrasound --negative gallbladder wall thickening or ductal dilatation  Increasing abdominal pain and distention -Suspect patient is developing ileus from his opiates -8/4 Repeat acute abdominal series--new left lobe consolidation, nonobstructive bowel gas, gas and stool scattered throughout colon -LUQ pain multifactorial including constipation, PNA, pancreatitis -start colace and bisacodyl supp-->add lactulose and senna -Abdominal pain and distention improved after multiple bowel movements  Fever/Leukocytosis -due to pancreatitis and CAP -improving -blood cultures x 2--neg to date -UA neg for pyuria  CAP -infiltrate developed after fluid hydration -IV levofloxacin--switch to by mouth  HTN: -d/c Lisinopril as this may also contribute to pancreatitis -BP remains acceptable  Alcohol abuse -counseling about the importance of quitting drinking -CIWA protocol -No signs of withdrawal  SOB (shortness of breath):  -secondary to hypoventilation from abdominal pain and PNA   Family Communication: Wife updated at beside Disposition Plan:  Home in 05/25/2015 if stable    Procedures/Studies: US Abdomen Complete  05/21/2015   CLINICAL DATA:  Recurrent alcoholic pancreatitis  EXAM: ULTRASOUND ABDOMEN COMPLETE  COMPARISON:  CT 05/20/2015  FINDINGS: Gallbladder: No gallstones or wall thickening visualized. No sonographic Murphy sign noted.  Common bile duct: Diameter: 4 mm  Liver: Hepatic cysts are reidentified. No solid focal lesion. No intrahepatic ductal dilatation.  IVC: No abnormality visualized.  Pancreas: Obscured by bowel gas  Spleen: Size and appearance within normal limits.  Right Kidney: Length: 11.6 cm. Echogenicity within normal limits. No mass or hydronephrosis visualized.  Left Kidney: Length: 11.2 cm. Echogenicity within normal limits. No mass or hydronephrosis visualized.  Abdominal aorta: No aneurysm visualized.  Other findings: None.  IMPRESSION: Normal exam.   Electronically Signed   By: Christiana Pellant M.D.   On: 05/21/2015 08:20   Ct Abdomen Pelvis W Contrast  05/20/2015   CLINICAL DATA:  Generalized abdominal pain, nausea and vomiting today.  EXAM: CT ABDOMEN AND PELVIS WITH CONTRAST  TECHNIQUE: Multidetector CT imaging of the abdomen and pelvis was performed using the standard protocol following bolus administration of intravenous contrast.  CONTRAST:  25mL OMNIPAQUE IOHEXOL 300 MG/ML SOLN, OMNIPAQUE IOHEXOL 300 MG/ML SOLN  COMPARISON:  None.  FINDINGS: Lower chest: The lung bases are clear of acute process. Very small bilateral pleural effusions and minimal basilar atelectasis. The heart is normal in size. No pericardial effusion.  Hepatobiliary: Small scattered low-attenuation liver lesions are likely benign cysts. No worrisome hepatic lesions or intrahepatic biliary dilatation. The gallbladder is normal. No common bile duct dilatation.  Pancreas: There is fullness and mild inflammation involving the pancreatic tail suggesting pancreatitis. There are surrounding hyperplastic/inflamed lymph nodes. No pancreatic mass or  ductal dilatation.  Spleen: Normal size.  No  focal lesions.  Adrenals/Urinary Tract: The adrenal glands are normal. Both kidneys are normal.  Stomach/Bowel: The stomach, duodenum, small bowel and colon are unremarkable. No inflammatory changes, mass lesions or obstructive findings. The terminal ileum is normal. The appendix is normal.  Vascular/Lymphatic: The aorta and branch vessels are normal. The major venous structures are patent. Small scattered retroperitoneal lymph nodes but no mass or adenopathy  Other: The bladder, prostate gland and seminal vesicles are unremarkable. No pelvic mass or adenopathy. No free pelvic fluid collections. No inguinal mass or adenopathy.  Musculoskeletal: No significant bony findings.  IMPRESSION: CT findings is suggestive of mild pancreatitis involving the pancreatic tail region.   Electronically Signed   By: Rudie Meyer M.D.   On: 05/20/2015 21:50   Dg Abd Acute W/chest  05/22/2015   CLINICAL DATA:  44 year old male with excruciating left lateral abdominal pain. Fever.  EXAM: DG ABDOMEN ACUTE W/ 1V CHEST  COMPARISON:  Acute abdominal series 05/20/2015.  FINDINGS: New area of airspace consolidation in the left lower lobe. Small left parapneumonic pleural effusion. Right lung is clear. No evidence of pulmonary edema. Heart size is upper limits of normal. The patient is rotated to the right on today's exam, resulting in distortion of the mediastinal contours and reduced diagnostic sensitivity and specificity for mediastinal pathology.  Gas and stool are seen scattered throughout the colon extending to the level of the distal rectum. Oral contrast material is also noted in the colon, predominantly in the cecum and ascending colon, presumably related to recent CT scan performed on 05/20/2015. No pathologic distension of small bowel is noted. No gross evidence of pneumoperitoneum.  IMPRESSION: 1. Nonobstructive bowel gas pattern. 2. No pneumoperitoneum. 3. New left lower lobe  airspace consolidation concerning for pneumonia or sequela of recent aspiration, with small left parapneumonic pleural effusion.   Electronically Signed   By: Trudie Reed M.D.   On: 05/22/2015 19:26   Dg Abd Acute W/chest  05/20/2015   CLINICAL DATA:  Emesis and abdominal pain for 1 day.  Hypertension.  EXAM: DG ABDOMEN ACUTE W/ 1V CHEST  COMPARISON:  Chest radiograph January 22, 2013  FINDINGS: PA chest: No edema or consolidation. Heart size and pulmonary vascularity are normal. No adenopathy. There is evidence of old trauma in the distal right clavicle region, stable.  Supine and upright abdomen: There is moderate stool in the colon. There is no bowel dilatation or air-fluid level suggesting obstruction. No free air. There is a tiny phlebolith in left pelvis.  IMPRESSION: Bowel gas pattern unremarkable. No obstruction or free air. No lung edema or consolidation.   Electronically Signed   By: Bretta Bang III M.D.   On: 05/20/2015 20:23         Subjective: Patient's abdominal pain is continuing to improve. Abdominal pain significant improvement after multiple bowel movements. Denies any nausea, vomiting, diarrhea, hematochezia, melena. Denies any fevers, chills, chest pain, coughing, shortness of breath  Objective: Filed Vitals:   05/23/15 1316 05/24/15 0009 05/24/15 0521 05/24/15 1148  BP: 131/88 136/83 145/86 148/91  Pulse: 89 80 84 79  Temp: 98.2 F (36.8 C) 98.2 F (36.8 C) 98.8 F (37.1 C) 98.4 F (36.9 C)  TempSrc: Oral Oral Oral Oral  Resp: Height:      Weight:      SpO2: 94% 95% 97% 96%    Intake/Output Summary (Last 24 hours) at 05/24/15 1613 Last data filed at 05/24/15 1046  Gross per 24 hour  Intake   1270 ml  Output      0 ml  Net   1270 ml   Weight change:  Exam:   General:  Pt is alert, follows commands appropriately, not in acute distress  HEENT: No icterus, No thrush, No neck mass, [place]/AT  Cardiovascular: RRR, S1/S2, no rubs, no  gallops  Respiratory: Left basilar crackles. R- clear to auscultation. No wheeze  Abdomen: Soft/+BS, LUQ tender without peritoneal signs, non distended, no guarding  Extremities: trace LE edema, No lymphangitis, No petechiae, No rashes, no synovitis  Data Reviewed: Basic Metabolic Panel:  Recent Labs Lab 05/20/15 1940 05/22/15 0224 05/23/15 0600 05/24/15 0516  NA 139 134* 134* 135  K 4.1 3.8 3.9 4.0  CL 105 106 107 106  CO2 26 21* 22 20*  GLUCOSE 120* 110* 98 85  BUN 17 7 11 12   CREATININE 0.95 0.92 0.97 0.88  CALCIUM 9.0 8.2* 8.1* 7.9*   Liver Function Tests:  Recent Labs Lab 05/20/15 1940 05/22/15 0224 05/23/15 0600  AST 32 17 14*  ALT 48 28 20  ALKPHOS 58 57 53  BILITOT 0.8 2.3* 2.0*  PROT 7.8 6.2* 6.0*  ALBUMIN 4.2 3.2* 2.6*    Recent Labs Lab 05/20/15 1940 05/22/15 0224  LIPASE 52* 77*   No results for input(s): AMMONIA in the last 168 hours. CBC:  Recent Labs Lab 05/20/15 1940 05/22/15 0224 05/23/15 0600 05/24/15 0516  WBC 12.8* 17.4* 15.6* 12.8*  HGB 15.3 14.2 12.6* 12.1*  HCT 43.3 40.5 36.4* 35.0*  MCV 90.2 90.8 91.2 90.7  PLT 190 164 160 183   Cardiac Enzymes: No results for input(s): CKTOTAL, CKMB, CKMBINDEX, TROPONINI in the last 168 hours. BNP: Invalid input(s): POCBNP CBG: No results for input(s): GLUCAP in the last 168 hours.  Recent Results (from the past 240 hour(s))  Culture, blood (routine x 2)     Status: None (Preliminary result)   Collection Time: 05/22/15  2:24 AM  Result Value Ref Range Status   Specimen Description BLOOD LEFT ANTECUBITAL  Final   Special Requests BOTTLES DRAWN AEROBIC AND ANAEROBIC 10CC,6CC  Final   Culture NO GROWTH 2 DAYS  Final   Report Status PENDING  Incomplete  Culture, blood (routine x 2)     Status: None (Preliminary result)   Collection Time: 05/22/15  2:34 AM  Result Value Ref Range Status   Specimen Description BLOOD LEFT HAND  Final   Special Requests BOTTLES DRAWN AEROBIC ONLY 10CC   Final   Culture NO GROWTH 2 DAYS  Final   Report Status PENDING  Incomplete     Scheduled Meds: . antiseptic oral rinse  7 mL Mouth Rinse q12n4p  . chlorhexidine gluconate  15 mL Mouth Rinse BID  . docusate sodium  100 mg Oral BID  . folic acid  1 mg Oral Daily  . heparin  5,000 Units Subcutaneous 3 times per day  . levofloxacin (LEVAQUIN) IV  750 mg Intravenous Q24H  . LORazepam  0-4 mg Intravenous Q12H  . multivitamin with minerals  1 tablet Oral Daily  . pantoprazole  40 mg Oral Q1200  . senna  2 tablet Oral Daily  . thiamine  100 mg Oral Daily   Or  . thiamine  100 mg Intravenous Daily   Continuous Infusions: . sodium chloride 100 mL/hr at 05/24/15 1556     Macio Kissoon, DO  [facility] Pager [phone]  If 7PM-7AM, please contact night-coverage [email or web address] Password [email or web address] 05/24/2015, 4:13 PM  LOS: 3 days

## 2015-05-25 LAB — CBC
HEMATOCRIT: 37.4 % — AB (ref 39.0–52.0)
Hemoglobin: 13 g/dL (ref 13.0–17.0)
MCH: 31.4 pg (ref 26.0–34.0)
MCHC: 34.8 g/dL (ref 30.0–36.0)
MCV: 90.3 fL (ref 78.0–100.0)
PLATELETS: 222 10*3/uL (ref 150–400)
RBC: 4.14 MIL/uL — ABNORMAL LOW (ref 4.22–5.81)
RDW: 12.2 % (ref 11.5–15.5)
WBC: 11 10*3/uL — ABNORMAL HIGH (ref 4.0–10.5)

## 2015-05-25 MED ORDER — AMLODIPINE BESYLATE 5 MG PO TABS: 5.0000 mg | ORAL_TABLET | Freq: Every day | ORAL | Status: DC

## 2015-05-25 MED ORDER — LEVOFLOXACIN 750 MG PO TABS: 750.0000 mg | ORAL_TABLET | Freq: Every morning | ORAL | Status: AC

## 2015-05-25 NOTE — Discharge Summary (Signed)
Physician Discharge Summary  Tyler Warren ZOX:[id number] DOB: 11-21-1970 DOA: 05/21/2015  PCP: Laren Boom, DO  Admit date: 05/21/2015 Discharge date: 05/25/2015  Recommendations for Outpatient Follow-up:  1. Pt will need to follow up with PCP in 2 weeks post discharge 2. Please obtain BMP and CBC 1-2 weeks  Discharge Diagnoses:  Recurrent acute pancreatitis:  -most likely due to alcochol abuse.  -CT scan showed mild inflammation at the tail of the pancreas -Advance to soft diet which pt tolerated -IVF: NS 125 cc/hr during the hospitalization -IV hydromorphone-->weaned off IV zofran for nausea -po protonix for possible alcoholic gastritis during hospitalzation -abdominal ultrasound --negative gallbladder wall thickening or ductal dilatation  Increasing abdominal pain and distention -Suspect patient is developing ileus from his opiates -8/4 Repeat acute abdominal series--new left lobe consolidation, nonobstructive bowel gas, gas and stool scattered throughout colon -LUQ pain multifactorial including constipation, PNA, pancreatitis -start colace and bisacodyl supp-->add lactulose and senna -Abdominal pain and distention improved after multiple bowel movements  Fever/Leukocytosis -due to pancreatitis and CAP -improving -blood cultures x 2--neg to date -UA neg for pyuria  CAP -infiltrate developed after fluid hydration -IV levofloxacin--switch to by mouth -4 more days of levofloxacin after d/c to complete 7 days tx  HTN: -d/c Lisinopril as this may also contribute to pancreatitis -home with amlodipine 5 mg daily  Alcohol abuse -counseling about the importance of quitting drinking -CIWA protocol -No signs of withdrawal  SOB (shortness of breath):  -secondary to hypoventilation from abdominal pain and PNA  Discharge Condition: stable   Disposition: home  Diet:soft Wt Readings from Last 3 Encounters:  05/21/15 96.163 kg (212 lb)  05/20/15 92.987 kg (205 lb)    04/28/15 92.08 kg (203 lb)    History of present illness:  44 y.o. male with PMH of alcohol abuse, alcoholic pancreatitis, hypertension, who presents with one day hx of nausea, vomiting and abdominal pain. Patient stated that he vomited 10 times on the day of admission. He also complained of left-sided abdominal pain without any alleviating or aggravating factors. The patient drinks 3 times per week 10 shots of liquor each time with beer x 10 years. Last drink was 3 days prior to admission.  The patient was treated conservatively with IV fluids, opiates, and bowel rest. Unfortunately, the patient continued to have abdominal pain and had some distention. This was thought to be due to constipation. Acute abdominal series was negative for any free air but confirmed a degree of constipation. The patient was given cathartics. After numerous bowel movements, the patient's abdominal distention and pain improved. His diet was advanced which he tolerated.  Discharge Exam: Filed Vitals:   05/25/15 0600  BP: 126/83  Pulse: 79  Temp: 99.3 F (37.4 C)  Resp: 18   Filed Vitals:   05/24/15 1148 05/24/15 1803 05/24/15 2347 05/25/15 0600  BP: 148/91 149/96 122/68 126/83  Pulse: 79 81 67 79  Temp: 98.4 F (36.9 C)  98.1 F (36.7 C) 99.3 F (37.4 C)  TempSrc: Oral  Oral Oral  Resp: Height:      Weight:      SpO2: 96% 100% 96% 97%   General: A&O x 3, NAD, pleasant, cooperative Cardiovascular: RRR, no rub, no gallop, no S3 Respiratory: CTAB, no wheeze, no rhonchi Abdomen:soft, nontender, nondistended, positive bowel sounds Extremities: No edema, No lymphangitis, no petechiae  Discharge Instructions      Discharge Instructions    Diet - low sodium heart healthy  Complete by:  As directed      Increase activity slowly    Complete by:  As directed             Medication List    STOP taking these medications        lisinopril 20 MG tablet  Commonly known as:   PRINIVIL,ZESTRIL      TAKE these medications        amLODipine 5 MG tablet  Commonly known as:  NORVASC  Take 1 tablet (5 mg total) by mouth daily.     levofloxacin 750 MG tablet  Commonly known as:  LEVAQUIN  Take 1 tablet (750 mg total) by mouth every morning. Start 05/26/15  Start taking on:  05/26/2015     ondansetron 4 MG tablet  Commonly known as:  ZOFRAN  Take 1 tablet (4 mg total) by mouth every 6 (six) hours.     oxyCODONE-acetaminophen 5-325 MG per tablet  Commonly known as:  PERCOCET/ROXICET  Take 1-2 tablets by mouth every 6 (six) hours as needed for severe pain.         The results of significant diagnostics from this hospitalization (including imaging, microbiology, ancillary and laboratory) are listed below for reference.    Significant Diagnostic Studies: US Abdomen Complete  05/21/2015   CLINICAL DATA:  Recurrent alcoholic pancreatitis  EXAM: ULTRASOUND ABDOMEN COMPLETE  COMPARISON:  CT 05/20/2015  FINDINGS: Gallbladder: No gallstones or wall thickening visualized. No sonographic Murphy sign noted.  Common bile duct: Diameter: 4 mm  Liver: Hepatic cysts are reidentified. No solid focal lesion. No intrahepatic ductal dilatation.  IVC: No abnormality visualized.  Pancreas: Obscured by bowel gas  Spleen: Size and appearance within normal limits.  Right Kidney: Length: 11.6 cm. Echogenicity within normal limits. No mass or hydronephrosis visualized.  Left Kidney: Length: 11.2 cm. Echogenicity within normal limits. No mass or hydronephrosis visualized.  Abdominal aorta: No aneurysm visualized.  Other findings: None.  IMPRESSION: Normal exam.   Electronically Signed   By: Christiana Pellant M.D.   On: 05/21/2015 08:20   Ct Abdomen Pelvis W Contrast  05/20/2015   CLINICAL DATA:  Generalized abdominal pain, nausea and vomiting today.  EXAM: CT ABDOMEN AND PELVIS WITH CONTRAST  TECHNIQUE: Multidetector CT imaging of the abdomen and pelvis was performed using the standard protocol  following bolus administration of intravenous contrast.  CONTRAST:  25mL OMNIPAQUE IOHEXOL 300 MG/ML SOLN, OMNIPAQUE IOHEXOL 300 MG/ML SOLN  COMPARISON:  None.  FINDINGS: Lower chest: The lung bases are clear of acute process. Very small bilateral pleural effusions and minimal basilar atelectasis. The heart is normal in size. No pericardial effusion.  Hepatobiliary: Small scattered low-attenuation liver lesions are likely benign cysts. No worrisome hepatic lesions or intrahepatic biliary dilatation. The gallbladder is normal. No common bile duct dilatation.  Pancreas: There is fullness and mild inflammation involving the pancreatic tail suggesting pancreatitis. There are surrounding hyperplastic/inflamed lymph nodes. No pancreatic mass or ductal dilatation.  Spleen: Normal size.  No focal lesions.  Adrenals/Urinary Tract: The adrenal glands are normal. Both kidneys are normal.  Stomach/Bowel: The stomach, duodenum, small bowel and colon are unremarkable. No inflammatory changes, mass lesions or obstructive findings. The terminal ileum is normal. The appendix is normal.  Vascular/Lymphatic: The aorta and branch vessels are normal. The major venous structures are patent. Small scattered retroperitoneal lymph nodes but no mass or adenopathy  Other: The bladder, prostate gland and seminal vesicles are unremarkable. No pelvic mass or adenopathy. No  free pelvic fluid collections. No inguinal mass or adenopathy.  Musculoskeletal: No significant bony findings.  IMPRESSION: CT findings is suggestive of mild pancreatitis involving the pancreatic tail region.   Electronically Signed   By: Rudie Meyer M.D.   On: 05/20/2015 21:50   Dg Abd Acute W/chest  05/22/2015   CLINICAL DATA:  44 year old male with excruciating left lateral abdominal pain. Fever.  EXAM: DG ABDOMEN ACUTE W/ 1V CHEST  COMPARISON:  Acute abdominal series 05/20/2015.  FINDINGS: New area of airspace consolidation in the left lower lobe. Small left  parapneumonic pleural effusion. Right lung is clear. No evidence of pulmonary edema. Heart size is upper limits of normal. The patient is rotated to the right on today's exam, resulting in distortion of the mediastinal contours and reduced diagnostic sensitivity and specificity for mediastinal pathology.  Gas and stool are seen scattered throughout the colon extending to the level of the distal rectum. Oral contrast material is also noted in the colon, predominantly in the cecum and ascending colon, presumably related to recent CT scan performed on 05/20/2015. No pathologic distension of small bowel is noted. No gross evidence of pneumoperitoneum.  IMPRESSION: 1. Nonobstructive bowel gas pattern. 2. No pneumoperitoneum. 3. New left lower lobe airspace consolidation concerning for pneumonia or sequela of recent aspiration, with small left parapneumonic pleural effusion.   Electronically Signed   By: Trudie Reed M.D.   On: 05/22/2015 19:26   Dg Abd Acute W/chest  05/20/2015   CLINICAL DATA:  Emesis and abdominal pain for 1 day.  Hypertension.  EXAM: DG ABDOMEN ACUTE W/ 1V CHEST  COMPARISON:  Chest radiograph January 22, 2013  FINDINGS: PA chest: No edema or consolidation. Heart size and pulmonary vascularity are normal. No adenopathy. There is evidence of old trauma in the distal right clavicle region, stable.  Supine and upright abdomen: There is moderate stool in the colon. There is no bowel dilatation or air-fluid level suggesting obstruction. No free air. There is a tiny phlebolith in left pelvis.  IMPRESSION: Bowel gas pattern unremarkable. No obstruction or free air. No lung edema or consolidation.   Electronically Signed   By: Bretta Bang III M.D.   On: 05/20/2015 20:23     Microbiology: Recent Results (from the past 240 hour(s))  Culture, blood (routine x 2)     Status: None (Preliminary result)   Collection Time: 05/22/15  2:24 AM  Result Value Ref Range Status   Specimen Description BLOOD  LEFT ANTECUBITAL  Final   Special Requests BOTTLES DRAWN AEROBIC AND ANAEROBIC 10CC,6CC  Final   Culture NO GROWTH 2 DAYS  Final   Report Status PENDING  Incomplete  Culture, blood (routine x 2)     Status: None (Preliminary result)   Collection Time: 05/22/15  2:34 AM  Result Value Ref Range Status   Specimen Description BLOOD LEFT HAND  Final   Special Requests BOTTLES DRAWN AEROBIC ONLY 10CC  Final   Culture NO GROWTH 2 DAYS  Final   Report Status PENDING  Incomplete     Labs: Basic Metabolic Panel:  Recent Labs Lab 05/20/15 1940 05/22/15 0224 05/23/15 0600 05/24/15 0516  NA 139 134* 134* 135  K 4.1 3.8 3.9 4.0  CL 105 106 107 106  CO2 26 21* 22 20*  GLUCOSE 120* 110* 98 85  BUN 17 7 11 12   CREATININE 0.95 0.92 0.97 0.88  CALCIUM 9.0 8.2* 8.1* 7.9*   Liver Function Tests:  Recent Labs Lab 05/20/15 1940  05/22/15 0224 05/23/15 0600  AST 32 17 14*  ALT 48 28 20  ALKPHOS 58 57 53  BILITOT 0.8 2.3* 2.0*  PROT 7.8 6.2* 6.0*  ALBUMIN 4.2 3.2* 2.6*    Recent Labs Lab 05/20/15 1940 05/22/15 0224  LIPASE 52* 77*   No results for input(s): AMMONIA in the last 168 hours. CBC:  Recent Labs Lab 05/20/15 1940 05/22/15 0224 05/23/15 0600 05/24/15 0516 05/25/15 0531  WBC 12.8* 17.4* 15.6* 12.8* 11.0*  HGB 15.3 14.2 12.6* 12.1* 13.0  HCT 43.3 40.5 36.4* 35.0* 37.4*  MCV 90.2 90.8 91.2 90.7 90.3  PLT 190 164 160 183 222   Cardiac Enzymes: No results for input(s): CKTOTAL, CKMB, CKMBINDEX, TROPONINI in the last 168 hours. BNP: Invalid input(s): POCBNP CBG: No results for input(s): GLUCAP in the last 168 hours.  Time coordinating discharge:  Greater than 30 minutes  Signed:  Fredonia Casalino, DO [facility] Pager: [phone] 05/25/2015, 8:11 AM

## 2015-05-27 LAB — CULTURE, BLOOD (ROUTINE X 2)
Culture: NO GROWTH
Culture: NO GROWTH

## 2015-05-30 ENCOUNTER — Ambulatory Visit (INDEPENDENT_AMBULATORY_CARE_PROVIDER_SITE_OTHER): Payer: Managed Care, Other (non HMO)

## 2015-05-30 ENCOUNTER — Encounter: Payer: Self-pay | Admitting: Family Medicine

## 2015-05-30 ENCOUNTER — Ambulatory Visit (INDEPENDENT_AMBULATORY_CARE_PROVIDER_SITE_OTHER): Payer: Managed Care, Other (non HMO) | Admitting: Family Medicine

## 2015-05-30 VITALS — BP 118/65 | HR 64 | Ht 69.0 in | Wt 199.0 lb

## 2015-05-30 DIAGNOSIS — J189 Pneumonia, unspecified organism: Secondary | ICD-10-CM

## 2015-05-30 DIAGNOSIS — K858 Other acute pancreatitis without necrosis or infection: Secondary | ICD-10-CM

## 2015-05-30 MED ORDER — OXYCODONE-ACETAMINOPHEN 5-325 MG PO TABS: 1.0000 | ORAL_TABLET | Freq: Every evening | ORAL | Status: DC | PRN

## 2015-05-30 NOTE — Progress Notes (Signed)
CC: Tyler Warren is a 44 y.o. male is here for Hospitalization Follow-up   Subjective: HPI:  Follow pancreatitis: He was hospitalized earlier this month for severe epigastric pain with nausea. He received fluids, was nothing by mouth and pain was managed with dilaudid. Since returning home his appetite has fully returned however he does have some epigastric pain with eating especially after eating a cheeseburger last night. Comes on within minutes of eating and goes away after an hour to eating. Pain is only mild in severity and absent right now. He denies any fevers, chills, vomiting, nausea, diarrhea or constipation. When lying down he has some reproduction of his epigastric pain however if he takes a Percocet before going to bed pain subsides quickly and he is able to fall asleep for the entire night.  Follow-up pneumonia he was also found to have a pneumonia soon after admission. It's felt that this was most likely "caught"while in the outpatient setting. He received at least 7 days of Levaquin and denies any shortness of breath or wheezing right now. He says it hurts in the lower left chest when taking a deep breath but he's had no other chest pain. He denies any cough.   Review Of Systems Outlined In HPI  Past Medical History  Diagnosis Date  . Head trauma   . Hypertension   . Alcoholic pancreatitis   . Alcohol abuse     Past Surgical History  Procedure Laterality Date  . Tracheostomy     No family history on file.  Social History   Social History  . Marital Status: Married    Spouse Name: N/A  . Number of Children: N/A  . Years of Education: N/A   Occupational History  . Not on file.   Social History Main Topics  . Smoking status: Never Smoker   . Smokeless tobacco: Not on file  . Alcohol Use: 3.5 oz/week    7 Standard drinks or equivalent per week  . Drug Use: No  . Sexual Activity: No   Other Topics Concern  . Not on file   Social History Narrative      Objective: BP 118/65 mmHg  Pulse 64  Ht 5\' 9"  (1.753 m)  Wt 199 lb (90.266 kg)  BMI 29.37 kg/m2  General: Alert and Oriented, No Acute Distress HEENT: Pupils equal, round, reactive to light. Conjunctivae clear.  Moist mucous membranes pharynx unremarkable Lungs: Clear to auscultation bilaterally however decreased lung sounds in the left lower posterior fields, no wheezing/ronchi/rales.  Comfortable work of breathing. Good air movement. Cardiac: Regular rate and rhythm. Normal S1/S2.  No murmurs, rubs, nor gallops.   Extremities: No peripheral edema.  Strong peripheral pulses.  Mental Status: No depression, anxiety, nor agitation. Skin: Warm and dry.  Assessment & Plan: Tyler Warren was seen today for hospitalization follow-up.  Diagnoses and all orders for this visit:  CAP (community acquired pneumonia) -     DG Chest 2 View; Future -     CBC -     BASIC METABOLIC PANEL WITH GFR  Other acute pancreatitis -     DG Chest 2 View; Future -     CBC -     BASIC METABOLIC PANEL WITH GFR -     Ambulatory referral to Gastroenterology  Other orders -     oxyCODONE-acetaminophen (PERCOCET/ROXICET) 5-325 MG per tablet; Take 1-2 tablets by mouth at bedtime as needed for severe pain.   Community acquired pneumonia: X-ray was obtained showing resolution of  pneumonia, he does have a effusion however does not appear to be enlarging since hospitalization. No further intervention Pancreatitis: Referral to gastroenterology given that this is his third episode and he does not believe that alcohol is contributing to this. Repeating calcium which was elevated during hospitalization and rechecking white count. Comes of this is improving, advised to cut back on fatty food intake  25 minutes spent face-to-face during visit today of which at least 50% was counseling or coordinating care regarding: 1. CAP (community acquired pneumonia)   2. Other acute pancreatitis      Return if symptoms worsen or fail  to improve.

## 2015-05-31 LAB — CBC
HCT: 40 % (ref 39.0–52.0)
Hemoglobin: 13.6 g/dL (ref 13.0–17.0)
MCH: 30.8 pg (ref 26.0–34.0)
MCHC: 34 g/dL (ref 30.0–36.0)
MCV: 90.7 fL (ref 78.0–100.0)
MPV: 10.2 fL (ref 8.6–12.4)
Platelets: 398 10*3/uL (ref 150–400)
RBC: 4.41 MIL/uL (ref 4.22–5.81)
RDW: 12.7 % (ref 11.5–15.5)
WBC: 9.7 10*3/uL (ref 4.0–10.5)

## 2015-05-31 LAB — BASIC METABOLIC PANEL WITH GFR
BUN: 15 mg/dL (ref 7–25)
CALCIUM: 8.9 mg/dL (ref 8.6–10.3)
CO2: 23 mmol/L (ref 20–31)
Chloride: 104 mmol/L (ref 98–110)
Creat: 0.95 mg/dL (ref 0.60–1.35)
GFR, Est African American: >89 mL/min (ref >=60)
GFR, Est Non African American: >89 mL/min (ref >=60)
Glucose, Bld: 114 mg/dL — ABNORMAL HIGH (ref 65–99)
Potassium: 4.3 mmol/L (ref 3.5–5.3)
Sodium: 142 mmol/L (ref 135–146)

## 2015-06-11 ENCOUNTER — Encounter: Payer: Self-pay | Admitting: Physician Assistant

## 2015-06-25 ENCOUNTER — Other Ambulatory Visit: Payer: Self-pay | Admitting: Family Medicine

## 2015-06-30 ENCOUNTER — Ambulatory Visit (INDEPENDENT_AMBULATORY_CARE_PROVIDER_SITE_OTHER): Payer: Managed Care, Other (non HMO) | Admitting: Physician Assistant

## 2015-06-30 ENCOUNTER — Other Ambulatory Visit (INDEPENDENT_AMBULATORY_CARE_PROVIDER_SITE_OTHER): Payer: Managed Care, Other (non HMO)

## 2015-06-30 ENCOUNTER — Encounter: Payer: Self-pay | Admitting: Physician Assistant

## 2015-06-30 VITALS — BP 136/92 | HR 88 | Ht 69.0 in | Wt 204.0 lb

## 2015-06-30 DIAGNOSIS — K859 Acute pancreatitis, unspecified: Secondary | ICD-10-CM

## 2015-06-30 LAB — HEPATIC FUNCTION PANEL
ALK PHOS: 63 U/L (ref 39–117)
ALT: 33 U/L (ref 0–53)
AST: 19 U/L (ref 0–37)
Albumin: 4.1 g/dL (ref 3.5–5.2)
BILIRUBIN DIRECT: 0.1 mg/dL (ref 0.0–0.3)
TOTAL PROTEIN: 7.2 g/dL (ref 6.0–8.3)
Total Bilirubin: 0.8 mg/dL (ref 0.2–1.2)

## 2015-06-30 LAB — AMYLASE: Amylase: 26 U/L — ABNORMAL LOW (ref 27–131)

## 2015-06-30 LAB — LIPASE: Lipase: 30 U/L (ref 11.0–59.0)

## 2015-06-30 NOTE — Progress Notes (Signed)
i agree with the above note, plan 

## 2015-06-30 NOTE — Patient Instructions (Signed)
Your physician has requested that you go to the basement for lab work before leaving today.  Please abstain from ETOH.  Start a low fat diet.

## 2015-06-30 NOTE — Progress Notes (Signed)
Patient ID: Tyler Warren, male   DOB: [birth year], 44 y.o.   MRN: [id number]    HPI:  Tyler Warren is a 44 y.o.   male referred by Tyler Boom, DO for evaluation of pancreatitis. Tyler Warren has a past history of hypertension, head trauma , and alcohol abuse. He states that over the past 3 years he has been diagnosed with pancreatitis in August or September. He reports that he use to drink heavily. He states that he did not drink during the week but it was common place for him 2 drink 2 or 3/5 of 5, or whiskey every weekend. Over the past year he has curtailed his drinking. At the time of his prior episodes of pancreatitis. He states he had been eating a lot of fatty foods as well. He presented to the emergency room on August 2 with moderate epigastric and left upper quadrant pain associated with vomiting. He states he has had a few drinks but had only had watermelon to eat. He had an abdominal ultrasound that showed no gallstones or wall thickening. There was no sonographic Murphy sign. The common bile duct was 4 mm. He had an abdominal CT that was suggestive of mild pancreatitis involving the pancreatic tail region. He was given IV fluids, morphine, and Zofran in the emergency room and discharged home. He was seen by his primary care provider August 12 for follow-up and was prescribed Percocet as needed for pain. He states he was feeling well and this past week and went to [facility] and drink for the past 4 days. He states he did not drink excessively. He states he also had some fried food. He had no recurrent pain or nausea. He asks if he can have repeat pancreatic enzymes drawn so he can see if they are again elevated after drinking for several days. He has not had any fever, chills, or night sweats. His appetite has been good and his weight has been stable. He has had no change in his bowel habits and denies bright red blood per rectum or melena.   Past Medical History  Diagnosis Date  . Head trauma   .  Hypertension   . Alcoholic pancreatitis   . Alcohol abuse     Past Surgical History  Procedure Laterality Date  . Tracheostomy     History reviewed. No pertinent family history. Social History  Substance Use Topics  . Smoking status: Never Smoker   . Smokeless tobacco: None  . Alcohol Use: 3.5 oz/week    7 Standard drinks or equivalent per week   Current Outpatient Prescriptions  Medication Sig Dispense Refill  . amLODipine (NORVASC) 5 MG tablet TAKE 1 TABLET (5 MG TOTAL) BY MOUTH DAILY. 30 tablet 4   No current facility-administered medications for this visit.   No Known Allergies   Review of Systems: Gen: Denies any fever, chills, sweats, anorexia, fatigue, weakness, malaise, weight loss, and sleep disorder CV: Denies chest pain, angina, palpitations, syncope, orthopnea, PND, peripheral edema, and claudication. Resp: Denies dyspnea at rest, dyspnea with exercise, cough, sputum, wheezing, coughing up blood, and pleurisy. GI: Denies vomiting blood, jaundice, and fecal incontinence.   Denies dysphagia or odynophagia. GU : Denies urinary burning, blood in urine, urinary frequency, urinary hesitancy, nocturnal urination, and urinary incontinence. MS: Denies joint pain, limitation of movement, and swelling, stiffness, low back pain, extremity pain. Denies muscle weakness, cramps, atrophy.  Derm: Denies rash, itching, dry skin, hives, moles, warts, or unhealing ulcers.  Psych: Denies depression, anxiety,  memory loss, suicidal ideation, hallucinations, paranoia, and confusion. Heme: Denies bruising, bleeding, and enlarged lymph nodes. Neuro:  Denies any headaches, dizziness, paresthesias. Endo:  Denies any problems with DM, thyroid, adrenal function  Studies:    Study Result     CLINICAL DATA: Recurrent alcoholic pancreatitis  EXAM: ULTRASOUND ABDOMEN COMPLETE  COMPARISON: CT 05/20/2015  FINDINGS: Gallbladder: No gallstones or wall thickening visualized.  No sonographic Murphy sign noted.  Common bile duct: Diameter: 4 mm  Liver: Hepatic cysts are reidentified. No solid focal lesion. No intrahepatic ductal dilatation.  IVC: No abnormality visualized.  Pancreas: Obscured by bowel gas  Spleen: Size and appearance within normal limits.  Right Kidney: Length: 11.6 cm. Echogenicity within normal limits. No mass or hydronephrosis visualized.  Left Kidney: Length: 11.2 cm. Echogenicity within normal limits. No mass or hydronephrosis visualized.  Abdominal aorta: No aneurysm visualized.  Other findings: None.  IMPRESSION: Normal exam.   Electronically Signed  By: Christiana Pellant M.D.  On: 05/21/2015 08:20   images for CT Abdomen Pelvis W Contrast     Study Result     CLINICAL DATA: Generalized abdominal pain, nausea and vomiting today.  EXAM: CT ABDOMEN AND PELVIS WITH CONTRAST  TECHNIQUE: Multidetector CT imaging of the abdomen and pelvis was performed using the standard protocol following bolus administration of intravenous contrast.  CONTRAST: 25mL OMNIPAQUE IOHEXOL 300 MG/ML SOLN, OMNIPAQUE IOHEXOL 300 MG/ML SOLN  COMPARISON: None.  FINDINGS: Lower chest: The lung bases are clear of acute process. Very small bilateral pleural effusions and minimal basilar atelectasis. The heart is normal in size. No pericardial effusion.  Hepatobiliary: Small scattered low-attenuation liver lesions are likely benign cysts. No worrisome hepatic lesions or intrahepatic biliary dilatation. The gallbladder is normal. No common bile duct dilatation.  Pancreas: There is fullness and mild inflammation involving the pancreatic tail suggesting pancreatitis. There are surrounding hyperplastic/inflamed lymph nodes. No pancreatic mass or ductal dilatation.  Spleen: Normal size. No focal lesions.  Adrenals/Urinary Tract: The adrenal glands are normal. Both kidneys are normal.  Stomach/Bowel:  The stomach, duodenum, small bowel and colon are unremarkable. No inflammatory changes, mass lesions or obstructive findings. The terminal ileum is normal. The appendix is normal.  Vascular/Lymphatic: The aorta and branch vessels are normal. The major venous structures are patent. Small scattered retroperitoneal lymph nodes but no mass or adenopathy  Other: The bladder, prostate gland and seminal vesicles are unremarkable. No pelvic mass or adenopathy. No free pelvic fluid collections. No inguinal mass or adenopathy.  Musculoskeletal: No significant bony findings.  IMPRESSION: CT findings is suggestive of mild pancreatitis involving the pancreatic tail region.   Electronically Signed  By: Rudie Meyer M.D.  On: 05/20/2015 21:50     LAB RESULTS:  chem profile 05/20/2015 alkaline phosphatase 58, lipase 52, AST 32, ALT 48, total bili 0.8   CBC 05/20/2015 white count 12.8, hemoglobin 15.3, hematocrit 43.3,  chem profile on 05/23/2015 total bili 2 , alkaline phosphatase 53, ALT 20, AST 14.  Physical Exam: BP 136/92 mmHg  Pulse 88  Ht 5\' 9"  (1.753 m)  Wt 204 lb (92.534 kg)  BMI 30.11 kg/m2 Constitutional: Pleasant,well-developed, male in no acute distress. HEENT: Normocephalic and atraumatic. Conjunctivae are normal. No scleral icterus. Neck supple.  No JVD Cardiovascular: Normal rate, regular rhythm.  Pulmonary/chest: Effort normal and breath sounds normal. No wheezing, rales or rhonchi. Abdominal: Soft, nondistended, nontender. Bowel sounds active throughout. There are no masses palpable. No hepatomegaly. Extremities: no edema Lymphadenopathy: No cervical adenopathy noted. Neurological: Alert  and oriented to person place and time. Skin: Skin is warm and dry. No rashes noted. Psychiatric: Normal mood and affect. Behavior is normal.  ASSESSMENT AND PLAN:   44 year old male with a history of 3 separate episodes of pancreatitis referred for evaluation. He readily  admits that his first 2 episodes occurred after episodes of heavy alcohol intake and excess fatty food intake. He had had some alcohol prior to this most recent episode but had been eating more fatty foods than normal. He is currently asymptomatic. An amylase, lipase, and hepatic function panel with fractionated bilirubin will be obtained. He has been encouraged to abstain from alcohol and to adhere to a low-fat diet. He will follow up on an as-needed basis.  Tyler Warren, Tollie [employer] PA-C 06/30/2015, 11:51 AM  CC: Tyler Boom, DO

## 2015-07-07 ENCOUNTER — Ambulatory Visit (INDEPENDENT_AMBULATORY_CARE_PROVIDER_SITE_OTHER): Payer: Managed Care, Other (non HMO) | Admitting: Family Medicine

## 2015-07-07 ENCOUNTER — Encounter: Payer: Self-pay | Admitting: Family Medicine

## 2015-07-07 VITALS — BP 173/93 | HR 70 | Temp 97.6°F | Wt 206.0 lb

## 2015-07-07 DIAGNOSIS — A499 Bacterial infection, unspecified: Secondary | ICD-10-CM

## 2015-07-07 DIAGNOSIS — B9689 Other specified bacterial agents as the cause of diseases classified elsewhere: Secondary | ICD-10-CM

## 2015-07-07 DIAGNOSIS — J329 Chronic sinusitis, unspecified: Secondary | ICD-10-CM

## 2015-07-07 MED ORDER — AMOXICILLIN-POT CLAVULANATE 500-125 MG PO TABS: ORAL_TABLET | ORAL | Status: AC

## 2015-07-07 MED ORDER — MONTELUKAST SODIUM 10 MG PO TABS: 10.0000 mg | ORAL_TABLET | Freq: Every day | ORAL | Status: DC

## 2015-07-07 NOTE — Progress Notes (Signed)
CC: Tyler Warren is a 44 y.o. male is here for Sinusitis   Subjective: HPI:   facial pressure in the cheeks, nasal congestion, fullness in the ears and sneezing that has been present off and on for the majority of the summer. Over the past week and a half symptoms have severely declined. Slight improvement with Zyrtec, nothing else seems to make better or worse. Pain in the cheeks is pounding and pulsatile. Overall symptoms are moderate in severity. Denies fevers, chills, cough, sore throat, nor shortness of breath. No headache or rash   Review Of Systems Outlined In HPI  Past Medical History  Diagnosis Date  . Head trauma   . Hypertension   . Alcoholic pancreatitis   . Alcohol abuse     Past Surgical History  Procedure Laterality Date  . Tracheostomy     No family history on file.  Social History   Social History  . Marital Status: Married    Spouse Name: N/A  . Number of Children: N/A  . Years of Education: N/A   Occupational History  . Not on file.   Social History Main Topics  . Smoking status: Never Smoker   . Smokeless tobacco: Not on file  . Alcohol Use: 3.5 oz/week    7 Standard drinks or equivalent per week  . Drug Use: No  . Sexual Activity: No   Other Topics Concern  . Not on file   Social History Narrative     Objective: BP 173/93 mmHg  Pulse 70  Temp(Src) 97.6 F (36.4 C) (Oral)  Wt 206 lb (93.441 kg)  General: Alert and Oriented, No Acute Distress HEENT: Pupils equal, round, reactive to light. Conjunctivae clear.  External ears unremarkable, canals clear with intact TMs with appropriate landmarks.  Middle ear appears open without effusion. Pink inferior turbinates.  Moist mucous membranes, pharynx without inflammation nor lesions.  Neck supple without palpable lymphadenopathy nor abnormal masses. Lungs: Clear to auscultation bilaterally, no wheezing/ronchi/rales.  Comfortable work of breathing. Good air movement. Extremities: No peripheral edema.   Strong peripheral pulses.  Mental Status: No depression, anxiety, nor agitation. Skin: Warm and dry.  Assessment & Plan: Tyler Warren was seen today for sinusitis.  Diagnoses and all orders for this visit:  Bacterial sinusitis -     amoxicillin-clavulanate (AUGMENTIN) 500-125 MG per tablet; Take one by mouth every 8 hours for ten total days. -     montelukast (SINGULAIR) 10 MG tablet; Take 1 tablet (10 mg total) by mouth at bedtime. To help with allergies.   Suspect seasonal allergies have lead to bacterial sinusitis therefore start Augmentin and consider starting Singulair along with Zyrtec if any allergy symptoms persist.   Return if symptoms worsen or fail to improve.

## 2015-09-26 ENCOUNTER — Encounter: Payer: Self-pay | Admitting: Family Medicine

## 2015-09-26 ENCOUNTER — Ambulatory Visit (INDEPENDENT_AMBULATORY_CARE_PROVIDER_SITE_OTHER): Payer: Managed Care, Other (non HMO) | Admitting: Family Medicine

## 2015-09-26 VITALS — BP 157/89 | HR 75 | Wt 202.0 lb

## 2015-09-26 DIAGNOSIS — J209 Acute bronchitis, unspecified: Secondary | ICD-10-CM

## 2015-09-26 MED ORDER — PREDNISONE 10 MG PO TABS: 30.0000 mg | ORAL_TABLET | Freq: Every day | ORAL | Status: DC

## 2015-09-26 MED ORDER — AZITHROMYCIN 250 MG PO TABS: 250.0000 mg | ORAL_TABLET | Freq: Every day | ORAL | Status: DC

## 2015-09-26 MED ORDER — IPRATROPIUM BROMIDE 0.06 % NA SOLN: 2.0000 | Freq: Four times a day (QID) | NASAL | Status: DC

## 2015-09-26 MED ORDER — TRAMADOL HCL 50 MG PO TABS: 50.0000 mg | ORAL_TABLET | Freq: Three times a day (TID) | ORAL | Status: DC | PRN

## 2015-09-26 NOTE — Assessment & Plan Note (Addendum)
Likely viral. Treat with prednisone and Atrovent nasal spray. He is azithromycin if not improving. Use tramadol to suppress cough and to treat moderate shoulder pain. Follow-up with PCP for lung issues and myself or shoulder pain if patient wishes.
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
## 2015-09-26 NOTE — Patient Instructions (Signed)
Thank you for coming in today. Follow up with Dr. Rexene EdisonH.  I am happy to see you for your shoulder if it continues to bother you.  Call or go to the emergency room if you get worse, have trouble breathing, have chest pains, or palpitations.   Acute Bronchitis Bronchitis is inflammation of the airways that extend from the windpipe into the lungs (bronchi). The inflammation often causes mucus to develop. This leads to a cough, which is the most common symptom of bronchitis.  In acute bronchitis, the condition usually develops suddenly and goes away over time, usually in a couple weeks. Smoking, allergies, and asthma can make bronchitis worse. Repeated episodes of bronchitis may cause further lung problems.  CAUSES Acute bronchitis is most often caused by the same virus that causes a cold. The virus can spread from person to person (contagious) through coughing, sneezing, and touching contaminated objects. SIGNS AND SYMPTOMS   Cough.   Fever.   Coughing up mucus.   Body aches.   Chest congestion.   Chills.   Shortness of breath.   Sore throat.  DIAGNOSIS  Acute bronchitis is usually diagnosed through a physical exam. Your health care provider will also ask you questions about your medical history. Tests, such as chest X-rays, are sometimes done to rule out other conditions.  TREATMENT  Acute bronchitis usually goes away in a couple weeks. Oftentimes, no medical treatment is necessary. Medicines are sometimes given for relief of fever or cough. Antibiotic medicines are usually not needed but may be prescribed in certain situations. In some cases, an inhaler may be recommended to help reduce shortness of breath and control the cough. A cool mist vaporizer may also be used to help thin bronchial secretions and make it easier to clear the chest.  HOME CARE INSTRUCTIONS  Get plenty of rest.   Drink enough fluids to keep your urine clear or pale yellow (unless you have a medical  condition that requires fluid restriction). Increasing fluids may help thin your respiratory secretions (sputum) and reduce chest congestion, and it will prevent dehydration.   Take medicines only as directed by your health care provider.  If you were prescribed an antibiotic medicine, finish it all even if you start to feel better.  Avoid smoking and secondhand smoke. Exposure to cigarette smoke or irritating chemicals will make bronchitis worse. If you are a smoker, consider using nicotine gum or skin patches to help control withdrawal symptoms. Quitting smoking will help your lungs heal faster.   Reduce the chances of another bout of acute bronchitis by washing your hands frequently, avoiding people with cold symptoms, and trying not to touch your hands to your mouth, nose, or eyes.   Keep all follow-up visits as directed by your health care provider.  SEEK MEDICAL CARE IF: Your symptoms do not improve after 1 week of treatment.  SEEK IMMEDIATE MEDICAL CARE IF:  You develop an increased fever or chills.   You have chest pain.   You have severe shortness of breath.  You have bloody sputum.   You develop dehydration.  You faint or repeatedly feel like you are going to pass out.  You develop repeated vomiting.  You develop a severe headache. MAKE SURE YOU:   Understand these instructions.  Will watch your condition.  Will get help right away if you are not doing well or get worse.   This information is not intended to replace advice given to you by your health care provider. Make  sure you discuss any questions you have with your health care provider.   Document Released: 11/11/2004 Document Revised: 10/25/2014 Document Reviewed: 03/27/2013 Elsevier Interactive Patient Education [employer].

## 2015-12-28 ENCOUNTER — Other Ambulatory Visit: Payer: Self-pay | Admitting: Family Medicine

## 2016-01-07 ENCOUNTER — Ambulatory Visit: Payer: Managed Care, Other (non HMO) | Admitting: Family Medicine

## 2016-01-28 ENCOUNTER — Other Ambulatory Visit: Payer: Self-pay | Admitting: Family Medicine

## 2016-02-04 ENCOUNTER — Ambulatory Visit (INDEPENDENT_AMBULATORY_CARE_PROVIDER_SITE_OTHER): Payer: Managed Care, Other (non HMO) | Admitting: Family Medicine

## 2016-02-04 ENCOUNTER — Ambulatory Visit (INDEPENDENT_AMBULATORY_CARE_PROVIDER_SITE_OTHER): Payer: Managed Care, Other (non HMO)

## 2016-02-04 ENCOUNTER — Encounter: Payer: Self-pay | Admitting: Family Medicine

## 2016-02-04 VITALS — BP 153/96 | HR 66 | Wt 203.0 lb

## 2016-02-04 DIAGNOSIS — M25532 Pain in left wrist: Secondary | ICD-10-CM | POA: Diagnosis not present

## 2016-02-04 DIAGNOSIS — M79642 Pain in left hand: Secondary | ICD-10-CM

## 2016-02-04 MED ORDER — AMLODIPINE BESYLATE 5 MG PO TABS: ORAL_TABLET | ORAL | Status: DC

## 2016-02-04 MED ORDER — IBUPROFEN 800 MG PO TABS: 800.0000 mg | ORAL_TABLET | Freq: Three times a day (TID) | ORAL | Status: DC | PRN
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
## 2016-03-29 ENCOUNTER — Other Ambulatory Visit: Payer: Self-pay | Admitting: Family Medicine

## 2016-06-16 ENCOUNTER — Other Ambulatory Visit: Payer: Self-pay | Admitting: Family Medicine

## 2016-06-16 DIAGNOSIS — B9689 Other specified bacterial agents as the cause of diseases classified elsewhere: Secondary | ICD-10-CM

## 2016-06-16 DIAGNOSIS — J329 Chronic sinusitis, unspecified: Principal | ICD-10-CM

## 2016-07-14 ENCOUNTER — Other Ambulatory Visit: Payer: Self-pay | Admitting: Family Medicine

## 2016-07-14 DIAGNOSIS — J329 Chronic sinusitis, unspecified: Principal | ICD-10-CM

## 2016-07-14 DIAGNOSIS — B9689 Other specified bacterial agents as the cause of diseases classified elsewhere: Secondary | ICD-10-CM

## 2016-07-22 ENCOUNTER — Other Ambulatory Visit: Payer: Self-pay | Admitting: Family Medicine

## 2016-07-22 DIAGNOSIS — B9689 Other specified bacterial agents as the cause of diseases classified elsewhere: Secondary | ICD-10-CM

## 2016-07-22 DIAGNOSIS — J329 Chronic sinusitis, unspecified: Principal | ICD-10-CM

## 2016-08-21 ENCOUNTER — Other Ambulatory Visit: Payer: Self-pay | Admitting: Family Medicine

## 2016-08-21 DIAGNOSIS — J329 Chronic sinusitis, unspecified: Principal | ICD-10-CM

## 2016-08-21 DIAGNOSIS — B9689 Other specified bacterial agents as the cause of diseases classified elsewhere: Secondary | ICD-10-CM

## 2016-09-24 ENCOUNTER — Other Ambulatory Visit: Payer: Self-pay | Admitting: Family Medicine

## 2019-08-03 ENCOUNTER — Other Ambulatory Visit: Payer: Self-pay

## 2019-08-03 ENCOUNTER — Encounter (HOSPITAL_BASED_OUTPATIENT_CLINIC_OR_DEPARTMENT_OTHER): Payer: Self-pay

## 2019-08-03 ENCOUNTER — Emergency Department (HOSPITAL_BASED_OUTPATIENT_CLINIC_OR_DEPARTMENT_OTHER): Payer: Managed Care, Other (non HMO)

## 2019-08-03 ENCOUNTER — Emergency Department (HOSPITAL_BASED_OUTPATIENT_CLINIC_OR_DEPARTMENT_OTHER)
Admission: EM | Admit: 2019-08-03 | Discharge: 2019-08-03 | Disposition: A | Discharge status: Home / Self Care | Payer: Managed Care, Other (non HMO) | Attending: Emergency Medicine | Admitting: Emergency Medicine

## 2019-08-03 DIAGNOSIS — R600 Localized edema: Secondary | ICD-10-CM | POA: Insufficient documentation

## 2019-08-03 DIAGNOSIS — R0602 Shortness of breath: Secondary | ICD-10-CM | POA: Insufficient documentation

## 2019-08-03 DIAGNOSIS — M79672 Pain in left foot: Secondary | ICD-10-CM | POA: Diagnosis not present

## 2019-08-03 DIAGNOSIS — Z79899 Other long term (current) drug therapy: Secondary | ICD-10-CM | POA: Diagnosis not present

## 2019-08-03 DIAGNOSIS — I10 Essential (primary) hypertension: Secondary | ICD-10-CM | POA: Diagnosis not present

## 2019-08-03 DIAGNOSIS — R609 Edema, unspecified: Secondary | ICD-10-CM

## 2019-08-03 DIAGNOSIS — J3489 Other specified disorders of nose and nasal sinuses: Secondary | ICD-10-CM | POA: Insufficient documentation

## 2019-08-03 DIAGNOSIS — R079 Chest pain, unspecified: Secondary | ICD-10-CM

## 2019-08-03 LAB — BASIC METABOLIC PANEL
Anion gap: 8 (ref 5–15)
BUN: 17 mg/dL (ref 6–20)
CO2: 25 mmol/L (ref 22–32)
Calcium: 9.2 mg/dL (ref 8.9–10.3)
Chloride: 104 mmol/L (ref 98–111)
Creatinine, Ser: 0.93 mg/dL (ref 0.61–1.24)
GFR calc Af Amer: >60 mL/min (ref >60)
GFR calc non Af Amer: >60 mL/min (ref >60)
Glucose, Bld: 94 mg/dL (ref 70–99)
Potassium: 4.2 mmol/L (ref 3.5–5.1)
Sodium: 137 mmol/L (ref 135–145)

## 2019-08-03 LAB — CBC
HCT: 44.8 % (ref 39.0–52.0)
Hemoglobin: 15.4 g/dL (ref 13.0–17.0)
MCH: 31.8 pg (ref 26.0–34.0)
MCHC: 34.4 g/dL (ref 30.0–36.0)
MCV: 92.6 fL (ref 80.0–100.0)
Platelets: 169 10*3/uL (ref 150–400)
RBC: 4.84 MIL/uL (ref 4.22–5.81)
RDW: 12.1 % (ref 11.5–15.5)
WBC: 10.2 10*3/uL (ref 4.0–10.5)
nRBC: 0 % (ref 0.0–0.2)

## 2019-08-03 LAB — TROPONIN I (HIGH SENSITIVITY): Troponin I (High Sensitivity): 3 ng/L (ref <18)

## 2019-08-03 LAB — D-DIMER, QUANTITATIVE: D-Dimer, Quant: 0.33 ug/mL-FEU (ref 0.00–0.50)

## 2019-08-03 NOTE — ED Notes (Signed)
Pt on monitor 

## 2019-08-03 NOTE — ED Triage Notes (Signed)
Pt states ongoing SOB/chest pressure x1 week, left leg swells intermittently, inspiratory crackles on left base per RT, denies heart condition but takes HTN meds, no fluid meds, good pedal pulses bilat, edema noted on LLE. Has been taking Tums with no relief.
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
## 2020-07-15 ENCOUNTER — Emergency Department (INDEPENDENT_AMBULATORY_CARE_PROVIDER_SITE_OTHER): Payer: Managed Care, Other (non HMO)

## 2020-07-15 ENCOUNTER — Encounter: Payer: Self-pay | Admitting: Emergency Medicine

## 2020-07-15 ENCOUNTER — Emergency Department (INDEPENDENT_AMBULATORY_CARE_PROVIDER_SITE_OTHER)
Admission: EM | Admit: 2020-07-15 | Discharge: 2020-07-15 | Disposition: A | Discharge status: Home / Self Care | Payer: Managed Care, Other (non HMO) | Source: Home / Self Care | Attending: Family Medicine | Admitting: Family Medicine

## 2020-07-15 ENCOUNTER — Other Ambulatory Visit: Payer: Self-pay

## 2020-07-15 ENCOUNTER — Other Ambulatory Visit: Payer: Self-pay | Admitting: Family Medicine

## 2020-07-15 DIAGNOSIS — R05 Cough: Secondary | ICD-10-CM | POA: Diagnosis not present

## 2020-07-15 DIAGNOSIS — J209 Acute bronchitis, unspecified: Secondary | ICD-10-CM

## 2020-07-15 MED ORDER — PREDNISONE 20 MG PO TABS: ORAL_TABLET | ORAL | 0 refills | Status: DC

## 2020-07-15 MED ORDER — AZITHROMYCIN 250 MG PO TABS: ORAL_TABLET | ORAL | 0 refills | Status: DC

## 2020-07-15 NOTE — ED Provider Notes (Addendum)
[facility]    CSN: [id number] Arrival date & time: 07/15/20  1040      History   Chief Complaint Chief Complaint  Patient presents with  . Cough    HPI Tyler Warren is a 49 y.o. male.   Five days ago patient developed mild sore throat, nasal congestion, cough, and mild myalgias.  He has had a headache every morning. Four days ago he developed tightness in his anterior chest without pleuritic pain.  He denies fever and shortness of breath.  The history is provided by the patient.    Past Medical History:  Diagnosis Date  . Alcohol abuse   . Alcoholic pancreatitis   . Head trauma   . Hypertension     Patient Active Problem List   Diagnosis Date Noted  . Acute bronchitis 09/26/2015  . Lobar pneumonia (HCC) 05/23/2015  . Other acute pancreatitis   . Alcohol abuse 05/21/2015  . SOB (shortness of breath) 05/21/2015  . Hyperlipidemia 04/29/2015  . Bilateral low back pain without sciatica 12/16/2014  . Recurrent acute pancreatitis 07/05/2014  . Essential hypertension, benign 06/17/2014  . History of traumatic brain injury 06/17/2014  . Numbness 06/17/2014    Past Surgical History:  Procedure Laterality Date  . TRACHEOSTOMY         Home Medications    Prior to Admission medications   Medication Sig Start Date End Date Taking? Authorizing Provider  amLODipine (NORVASC) 5 MG tablet TAKE 1 TABLET (5 MG TOTAL) BY MOUTH DAILY. 02/04/16   Hommel, Gregary Signs, DO  azithromycin (ZITHROMAX Z-PAK) 250 MG tablet Take 2 tabs today; then begin one tab once daily for 4 more days. 07/15/20   Lattie Haw, MD  ibuprofen (ADVIL,MOTRIN) 800 MG tablet TAKE 1 TABLET (800 MG TOTAL) BY MOUTH EVERY 8 (EIGHT) HOURS AS NEEDED FOR MILD PAIN. 03/29/16   Laren Boom, DO  predniSONE (DELTASONE) 20 MG tablet Take one tab by mouth twice daily for 4 days, then one daily for 3 days. Take with food. 07/15/20   Lattie Haw, MD  traMADol (ULTRAM) 50 MG tablet Take 1 tablet (50 mg total)  by mouth every 8 (eight) hours as needed (pain or cough). 09/26/15   Rodolph Bong, MD    Family History Family History  Problem Relation Age of Onset  . Healthy Mother   . Healthy Father     Social History Social History   Tobacco Use  . Smoking status: Never Smoker  . Smokeless tobacco: Never Used  Vaping Use  . Vaping Use: Never used  Substance Use Topics  . Alcohol use: Yes    Alcohol/week: 7.0 standard drinks    Types: 7 Standard drinks or equivalent per week  . Drug use: Yes    Types: Marijuana    Comment: occ     Allergies   Hydrocodone   Review of Systems Review of Systems + sore throat + cough No pleuritic pain, but has tightness in his anterior chest. No wheezing + nasal congestion + post-nasal drainage No sinus pain/pressure No itchy/red eyes No earache No hemoptysis No SOB No fever/chills No nausea No vomiting No abdominal pain No diarrhea No urinary symptoms No skin rash + fatigue + myalgias + headache Used OTC meds (Mucinex) without relief   Physical Exam Triage Vital Signs ED Triage Vitals  Enc Vitals Group     BP 07/15/20 1129 138/84     Pulse Rate 07/15/20 1129 72     Resp 07/15/20  1129 18     Temp 07/15/20 1129 98.3 F (36.8 C)     Temp Source 07/15/20 1129 Oral     SpO2 07/15/20 1129 97 %     Weight 07/15/20 1130 230 lb (104.3 kg)     Height 07/15/20 1130 5\' 9"  (1.753 m)     Head Circumference --      Peak Flow --      Pain Score 07/15/20 1130 1     Pain Loc --      Pain Edu? --      Excl. in GC? --    No data found.  Updated Vital Signs BP 138/84 (BP Location: Right Arm)   Pulse 72   Temp 98.3 F (36.8 C) (Oral)   Resp 18   Ht 5\' 9"  (1.753 m)   Wt 104.3 kg   SpO2 97%   BMI 33.97 kg/m   Visual Acuity Right Eye Distance:   Left Eye Distance:   Bilateral Distance:    Right Eye Near:   Left Eye Near:    Bilateral Near:     Physical Exam Nursing notes and Vital Signs reviewed. Appearance:  Patient  appears stated age, and in no acute distress Eyes:  Pupils are equal, round, and reactive to light and accomodation.  Extraocular movement is intact.  Conjunctivae are not inflamed  Ears:  Canals normal.  Tympanic membranes normal.  Nose:  Congested turbinates.  No sinus tenderness.  Pharynx:  Normal Neck:  Supple.  Mildly enlarged lateral nodes are present, tender to palpation on the left.   Lungs:  Diffuse bilateral wheezes.  Breath sounds are equal.  Moving air well. Heart:  Regular rate and rhythm without murmurs, rubs, or gallops.  Abdomen:  Nontender without masses or hepatosplenomegaly.  Bowel sounds are present.  No CVA or flank tenderness.  Extremities:  No edema.  Skin:  No rash present.   UC Treatments / Results  Labs (all labs ordered are listed, but only abnormal results are displayed) Labs Reviewed  NOVEL CORONAVIRUS, NAA    EKG   Radiology DG Chest 2 View  Result Date: 07/15/2020 CLINICAL DATA:  Productive cough. EXAM: CHEST - 2 VIEW COMPARISON:  August 03, 2019. FINDINGS: The heart size and mediastinal contours are within normal limits. Both lungs are clear. The visualized skeletal structures are unremarkable. IMPRESSION: No active cardiopulmonary disease. Electronically Signed   By: 07/17/2020 M.D.   On: 07/15/2020 13:45    Procedures Procedures (including critical care time)  Medications Ordered in UC Medications - No data to display  Initial Impression / Assessment and Plan / UC Course  I have reviewed the triage vital signs and the nursing notes.  Pertinent labs & imaging results that were available during my care of the patient were reviewed by me and considered in my medical decision making (see chart for details).    Begin Z-pak and prednisone burst/taper. COVID19 PCR pending. Followup with Family Doctor if not improved in one week.   Final Clinical Impressions(s) / UC Diagnoses   Final diagnoses:  Acute bronchitis, unspecified organism      Discharge Instructions     Take plain guaifenesin (1200mg  extended release tabs such as Mucinex) twice daily, with plenty of water, for cough and congestion.  Get adequate rest.   May take Delsym Cough Suppressant ("12 Hour Cough Relief") at bedtime for nighttime cough (If needed, may take Tessalon with Delsym). Try warm salt water gargles for sore throat.  Stop all antihistamines for now, and other non-prescription cough/cold preparations. May take Tylenol as needed for pain, fever, etc.  Isolate yourself until COVID-19 test result is available.  If your COVID19 test is positive, then you are infected with the novel coronavirus and could give the virus to others.  Please continue isolation at home for at least 10 days since the start of your symptoms.  Once you complete your 10 day quarantine, you may return to normal activities as long as you've not had a fever for over 24 hours (without taking fever reducing medicine) and your symptoms are improving. Please continue good preventive care measures, including:  frequent hand-washing, avoid touching your face, cover coughs/sneezes, stay out of crowds and keep a 6 foot distance from others.  Go to the nearest hospital emergency room if fever/cough/breathlessness are severe or illness seems like a threat to life.    ED Prescriptions    Medication Sig Dispense Auth. Provider   azithromycin (ZITHROMAX Z-PAK) 250 MG tablet Take 2 tabs today; then begin one tab once daily for 4 more days. 6 tablet Lattie Haw, MD   predniSONE (DELTASONE) 20 MG tablet Take one tab by mouth twice daily for 4 days, then one daily for 3 days. Take with food. 11 tablet Lattie Haw, MD        Lattie Haw, MD 07/15/20 1409    Lattie Haw, MD 07/17/20 1438

## 2020-07-15 NOTE — ED Triage Notes (Signed)
Productive cough, runny nose, painful nose x 6 days. Vaccinated

## 2020-07-15 NOTE — Discharge Instructions (Addendum)
Take plain guaifenesin (1200mg  extended release tabs such as Mucinex) twice daily, with plenty of water, for cough and congestion.  Get adequate rest.   May take Delsym Cough Suppressant ("12 Hour Cough Relief") at bedtime for nighttime cough (If needed, may take Tessalon with Delsym). Try warm salt water gargles for sore throat.  Stop all antihistamines for now, and other non-prescription cough/cold preparations. May take Tylenol as needed for pain, fever, etc.  Isolate yourself until COVID-19 test result is available.  If your COVID19 test is positive, then you are infected with the novel coronavirus and could give the virus to others.  Please continue isolation at home for at least 10 days since the start of your symptoms.  Once you complete your 10 day quarantine, you may return to normal activities as long as you've not had a fever for over 24 hours (without taking fever reducing medicine) and your symptoms are improving. Please continue good preventive care measures, including:  frequent hand-washing, avoid touching your face, cover coughs/sneezes, stay out of crowds and keep a 6 foot distance from others.  Go to the nearest hospital emergency room if fever/cough/breathlessness are severe or illness seems like a threat to life.

## 2020-07-17 LAB — NOVEL CORONAVIRUS, NAA: SARS-CoV-2, NAA: NOT DETECTED

## 2020-07-17 LAB — SARS-COV-2, NAA 2 DAY TAT

## 2020-10-28 IMAGING — CR DG CHEST 2V
2 series · 2 of 2 positions shown · non-contrast
Comparison: 05/30/2015

CLINICAL DATA: Chest pain, shortness of breath, and left leg
swelling for 1 week.

EXAM:
CHEST - 2 VIEW

[w chest pa]
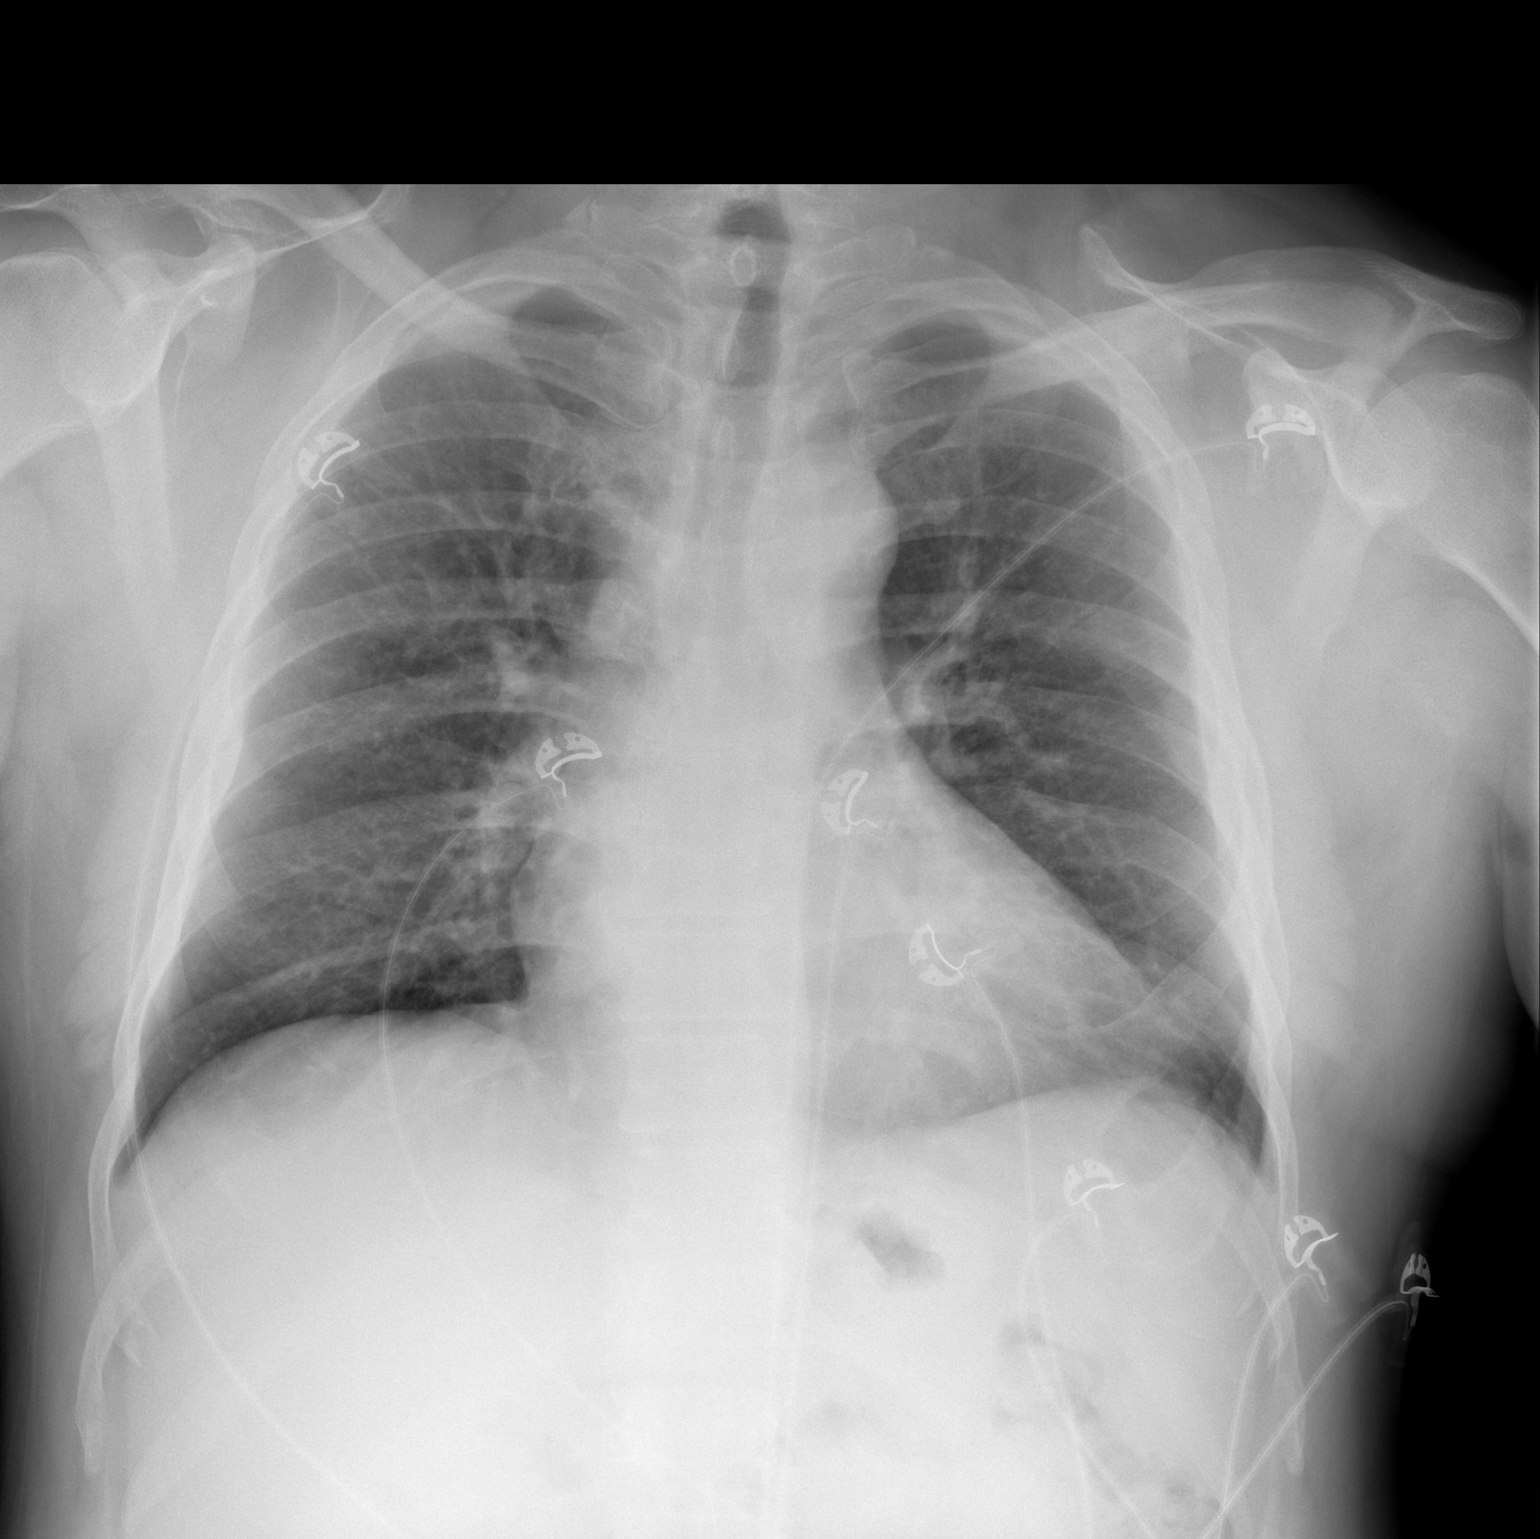

[w chest lat]
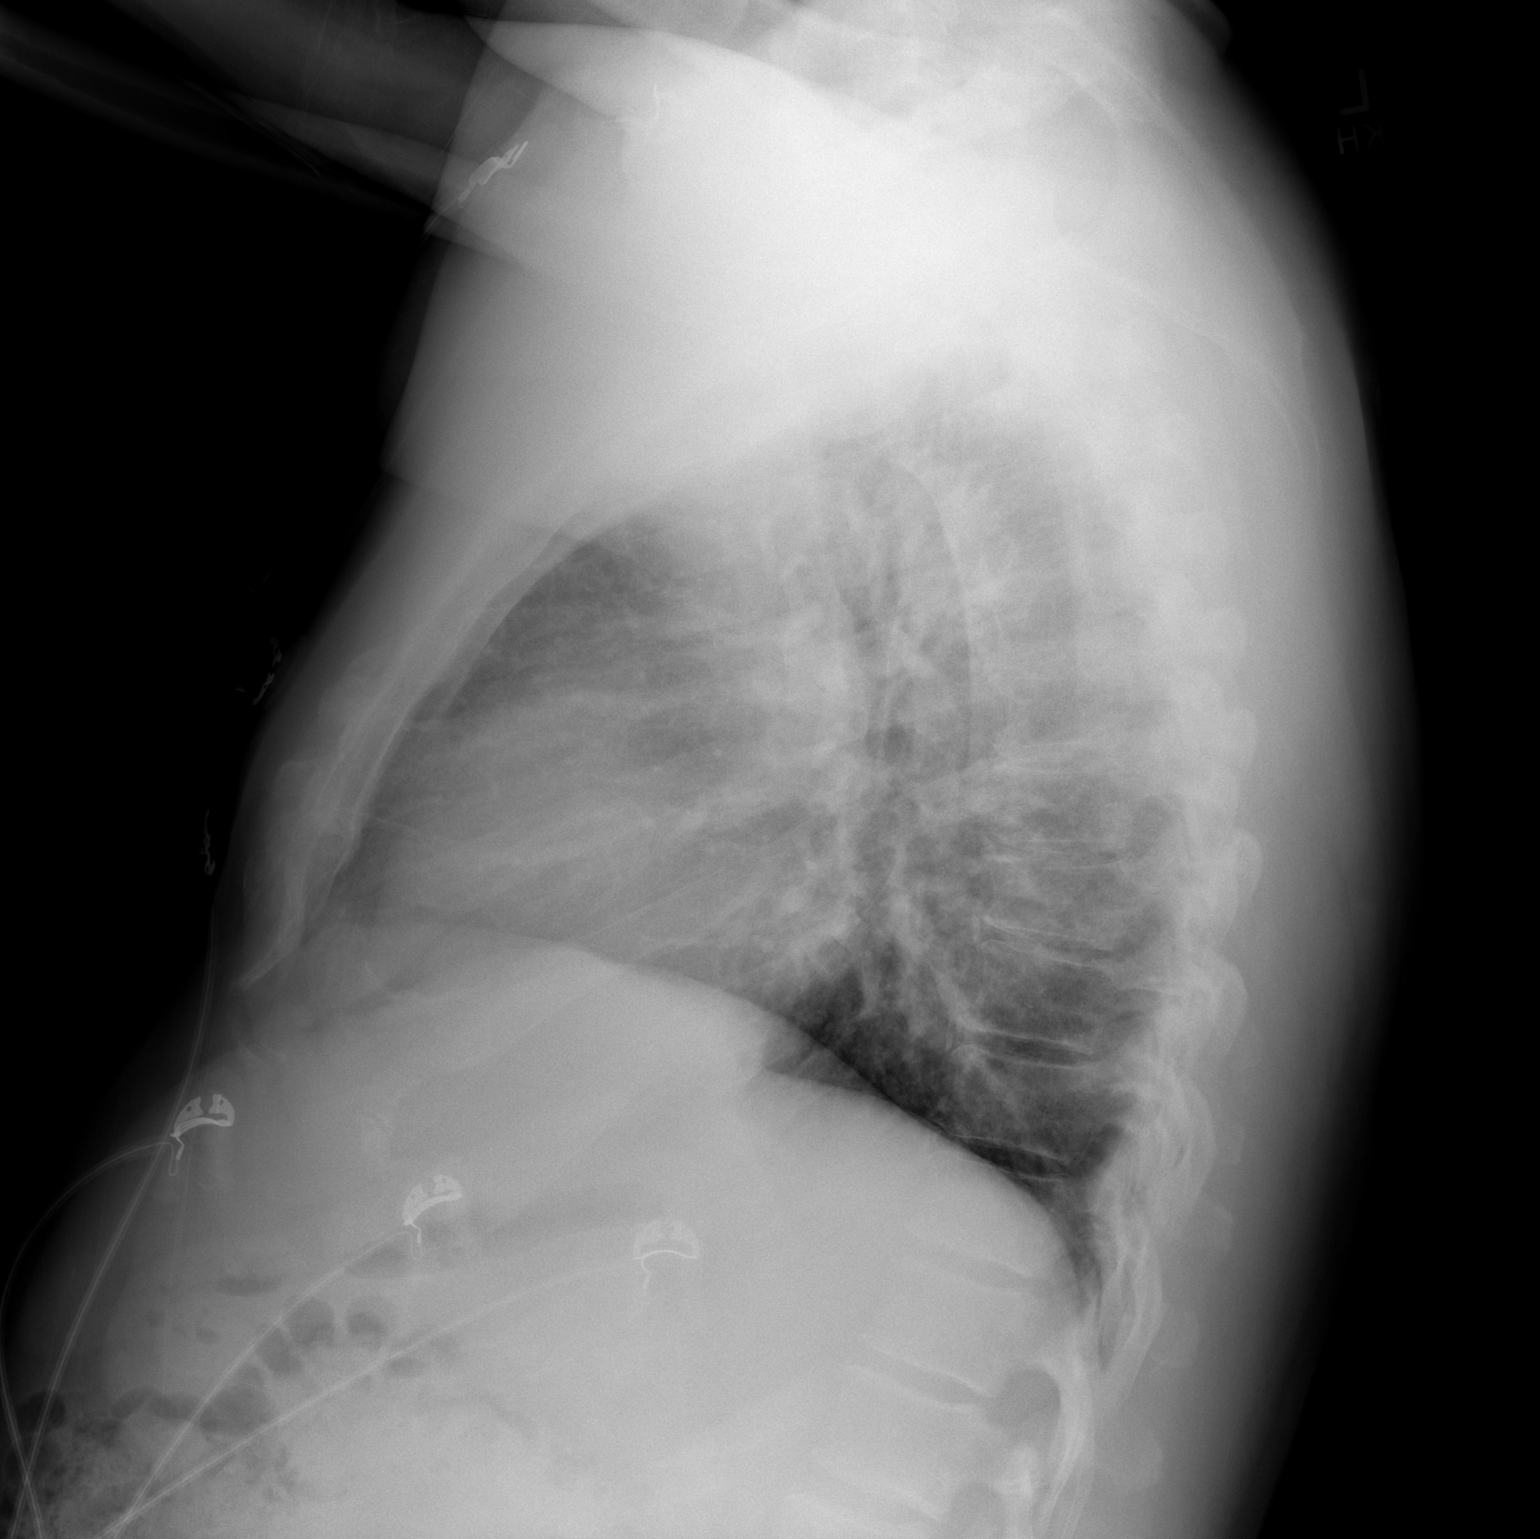

[2 of 2 positions shown; findings below may reference images not displayed]

FINDINGS: The heart size and mediastinal contours are within normal limits.
Both lungs are clear. The visualized skeletal structures are
unremarkable.
IMPRESSION: No active cardiopulmonary disease.

## 2020-10-28 IMAGING — US US EXTREM LOW VENOUS*L*
1 series · 13 of 24 positions shown · non-contrast
Comparison: None.

CLINICAL DATA: Leg swelling.



[Series 1: us extrem low venous*left* · 13 of 35 slices shown]
[im 1/35]
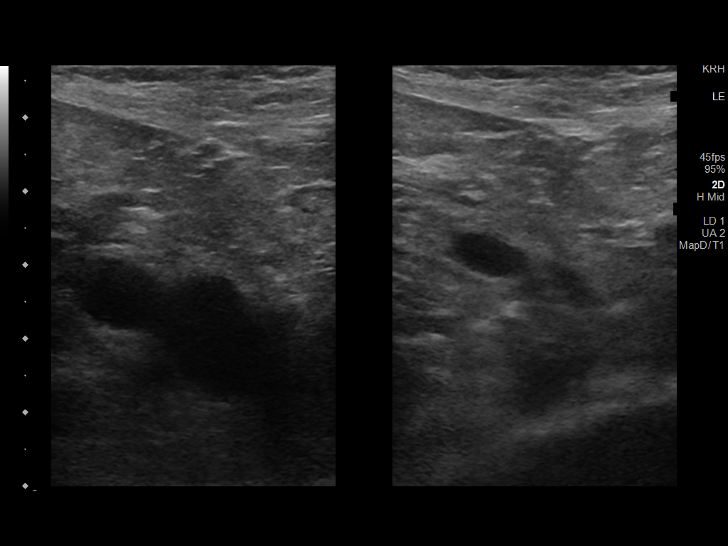
[im 3/35]
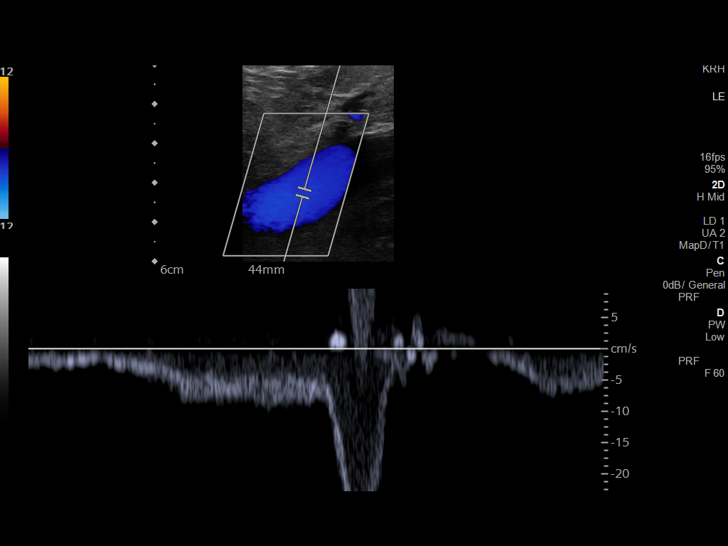
[im 6/35]
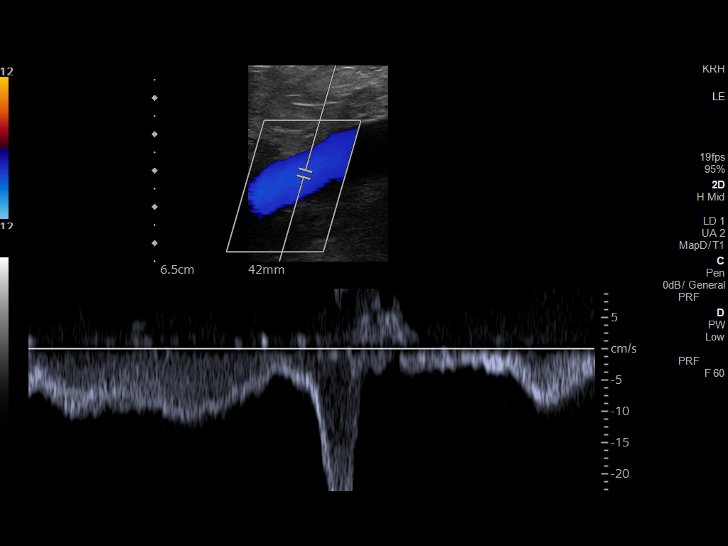
[im 9/35]
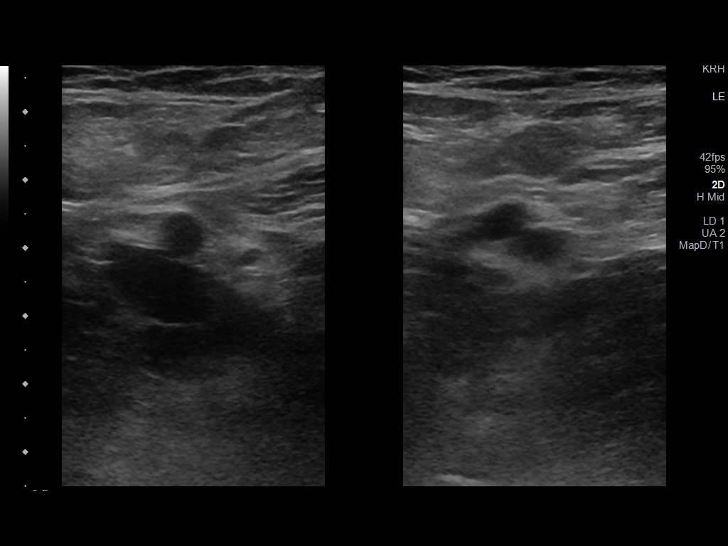
[im 12/35]
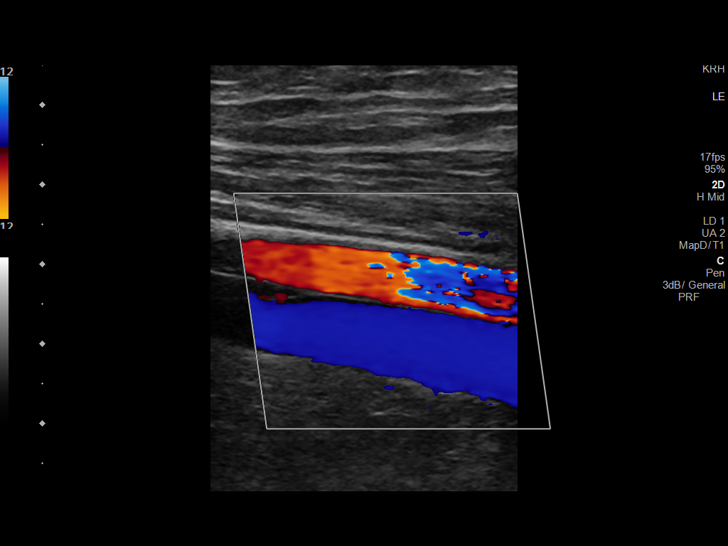
[im 15/35]
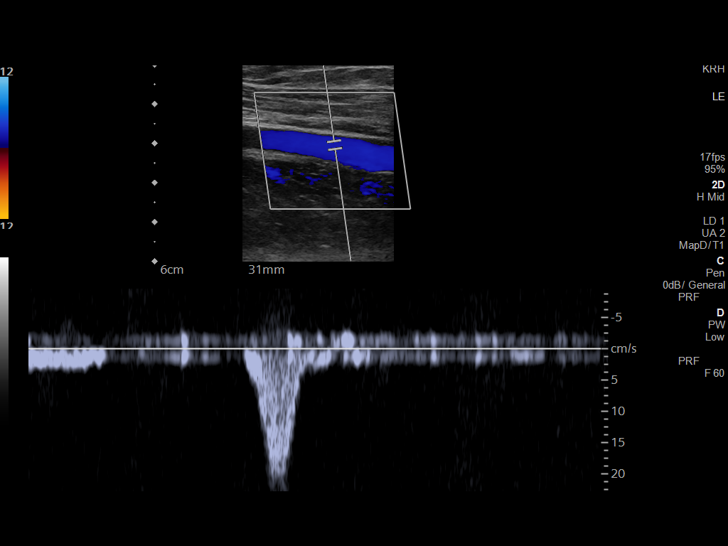
[im 18/35]
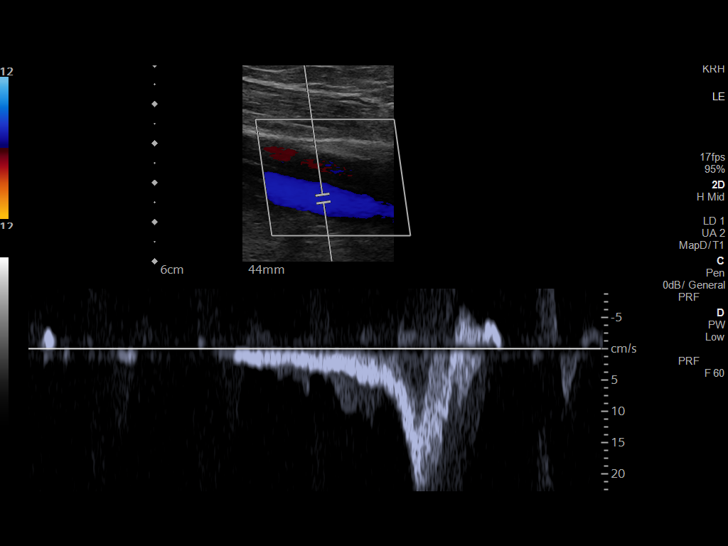
[im 20/35]
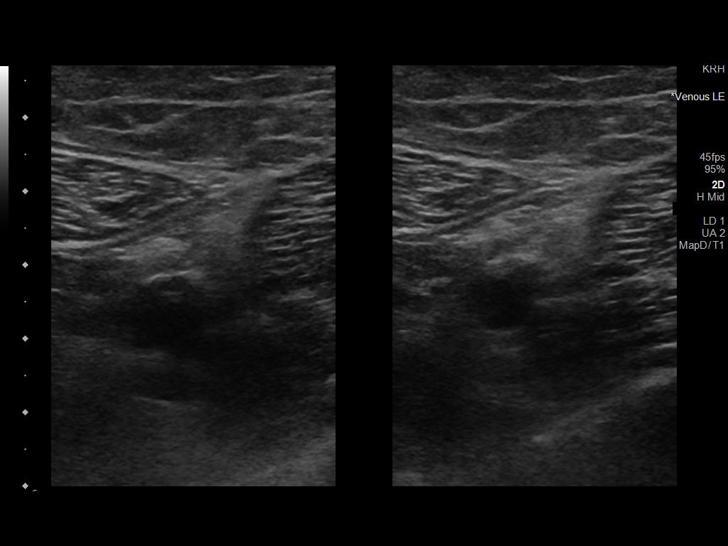
[im 23/35]
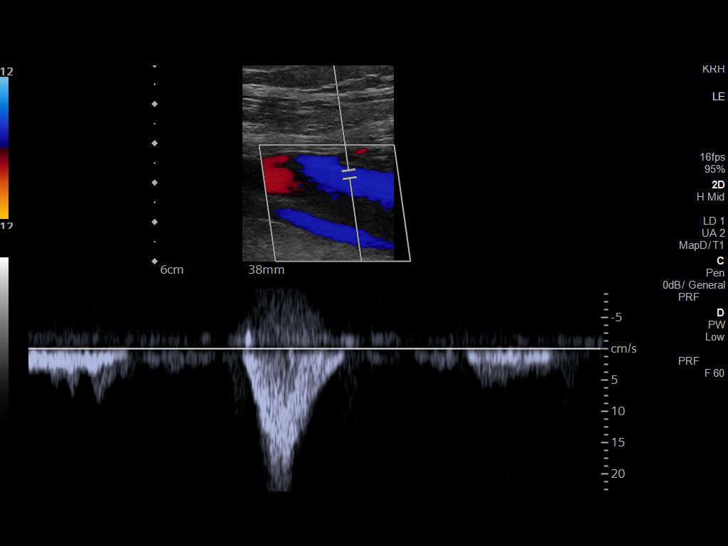
[im 26/35]
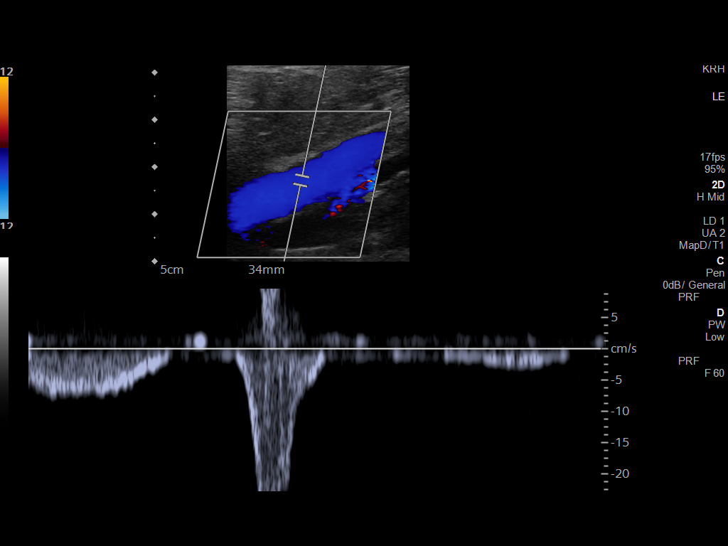
[im 29/35]
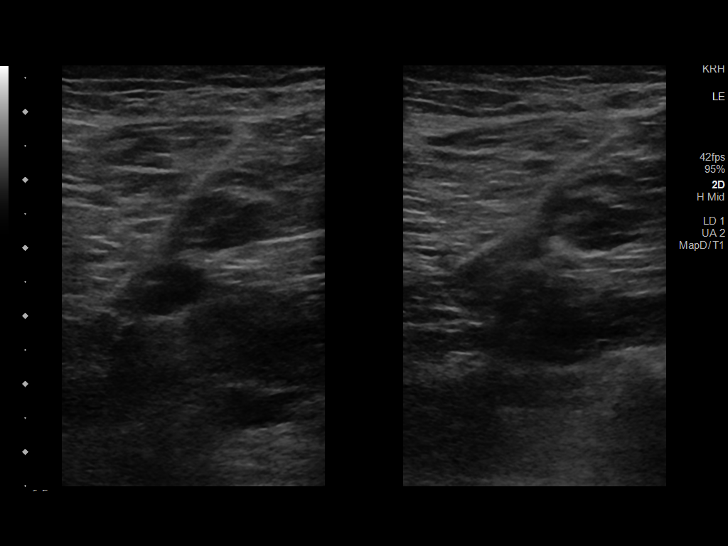
[im 32/35]
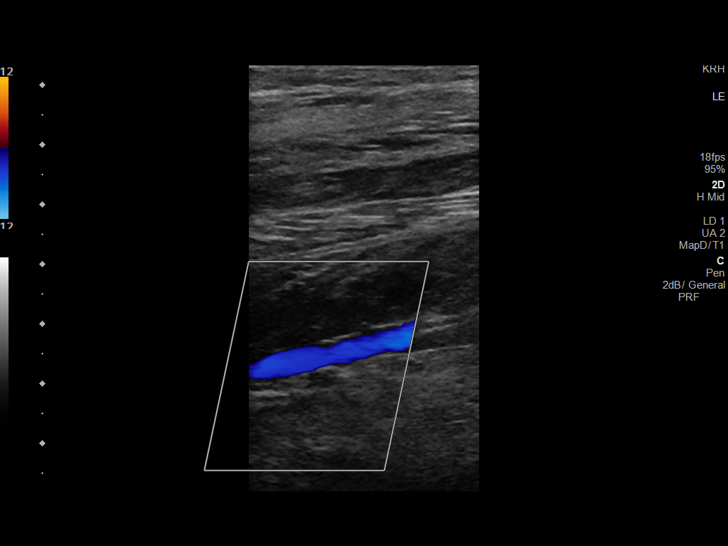
[im 35/35]
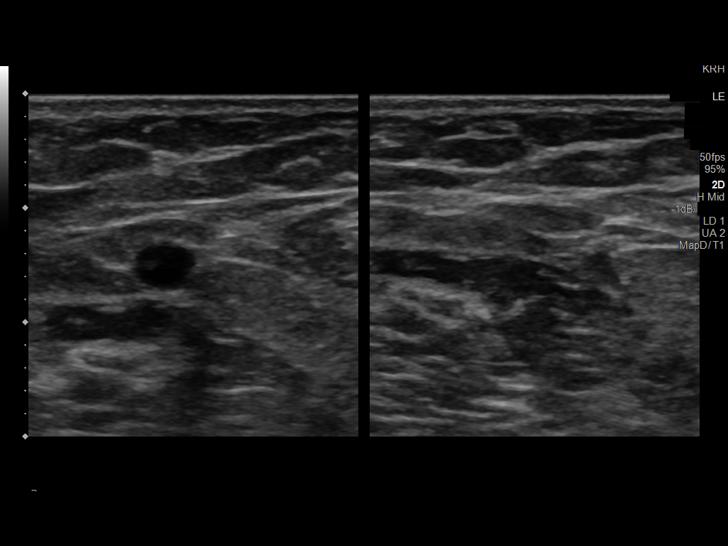

[13 of 24 positions shown; findings below may reference images not displayed]

FINDINGS: Contralateral Common Femoral Vein: Respiratory phasicity is normal
and symmetric with the symptomatic side. No evidence of thrombus.
Normal compressibility.

Common Femoral Vein: No evidence of thrombus. Normal
compressibility, respiratory phasicity and response to augmentation.

Saphenofemoral Junction: No evidence of thrombus. Normal
compressibility and flow on color Doppler imaging.

Profunda Femoral Vein: No evidence of thrombus. Normal
compressibility and flow on color Doppler imaging.

Femoral Vein: No evidence of thrombus. Normal compressibility,
respiratory phasicity and response to augmentation.

Popliteal Vein: No evidence of thrombus. Normal compressibility,
respiratory phasicity and response to augmentation.

Calf Veins: No evidence of thrombus. Normal compressibility and flow
on color Doppler imaging.

Superficial Great Saphenous Vein: No evidence of thrombus. Normal
compressibility.

Venous Reflux:  None.

Other Findings:  None.
IMPRESSION: No evidence of deep venous thrombosis.

## 2021-09-12 LAB — COLOGUARD: COLOGUARD: NEGATIVE

## 2021-10-10 IMAGING — DX DG CHEST 2V
2 series · 2 of 2 positions shown · non-contrast
Comparison: August 03, 2019.

CLINICAL DATA: Productive cough.

EXAM:
CHEST - 2 VIEW

[chest pa]
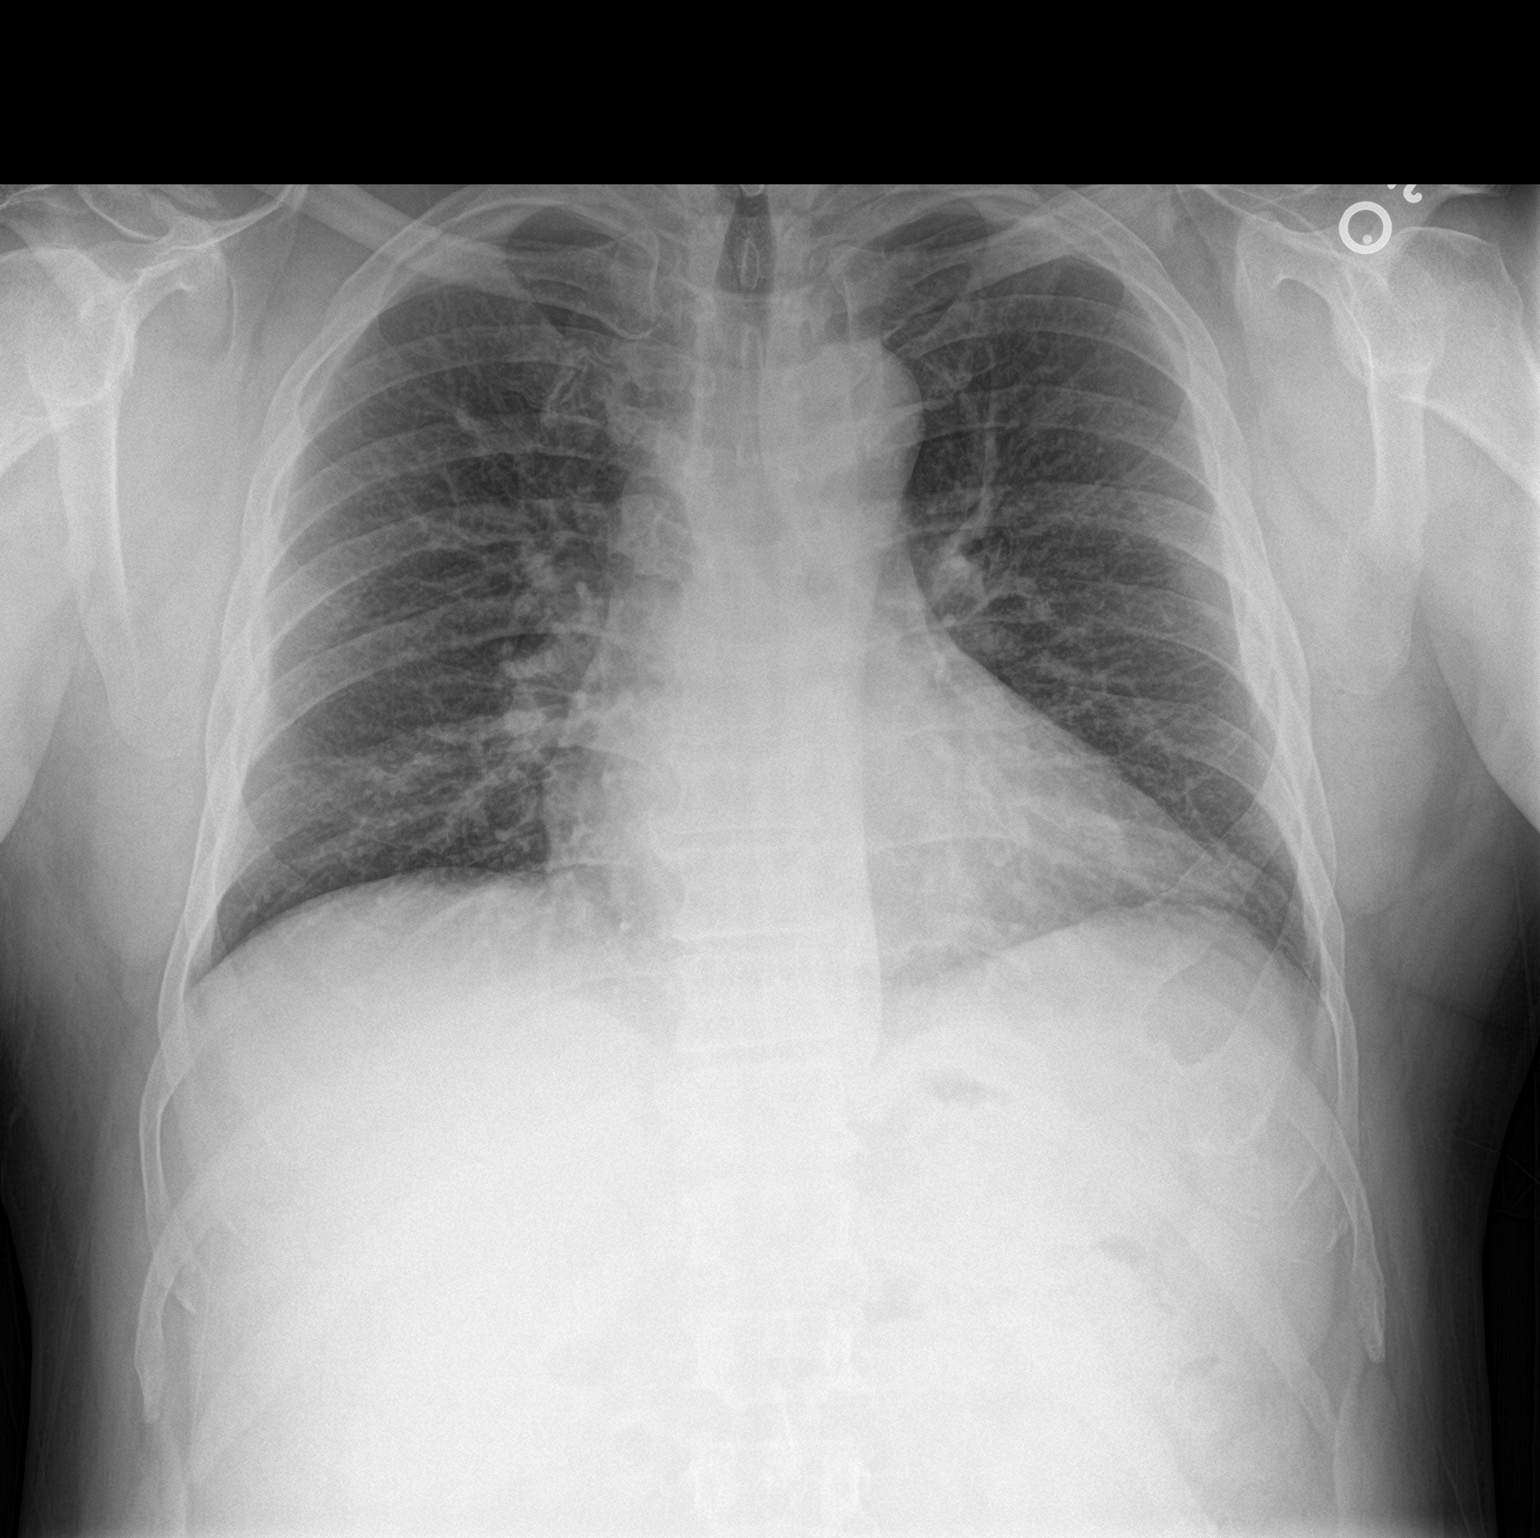

[chest lat]
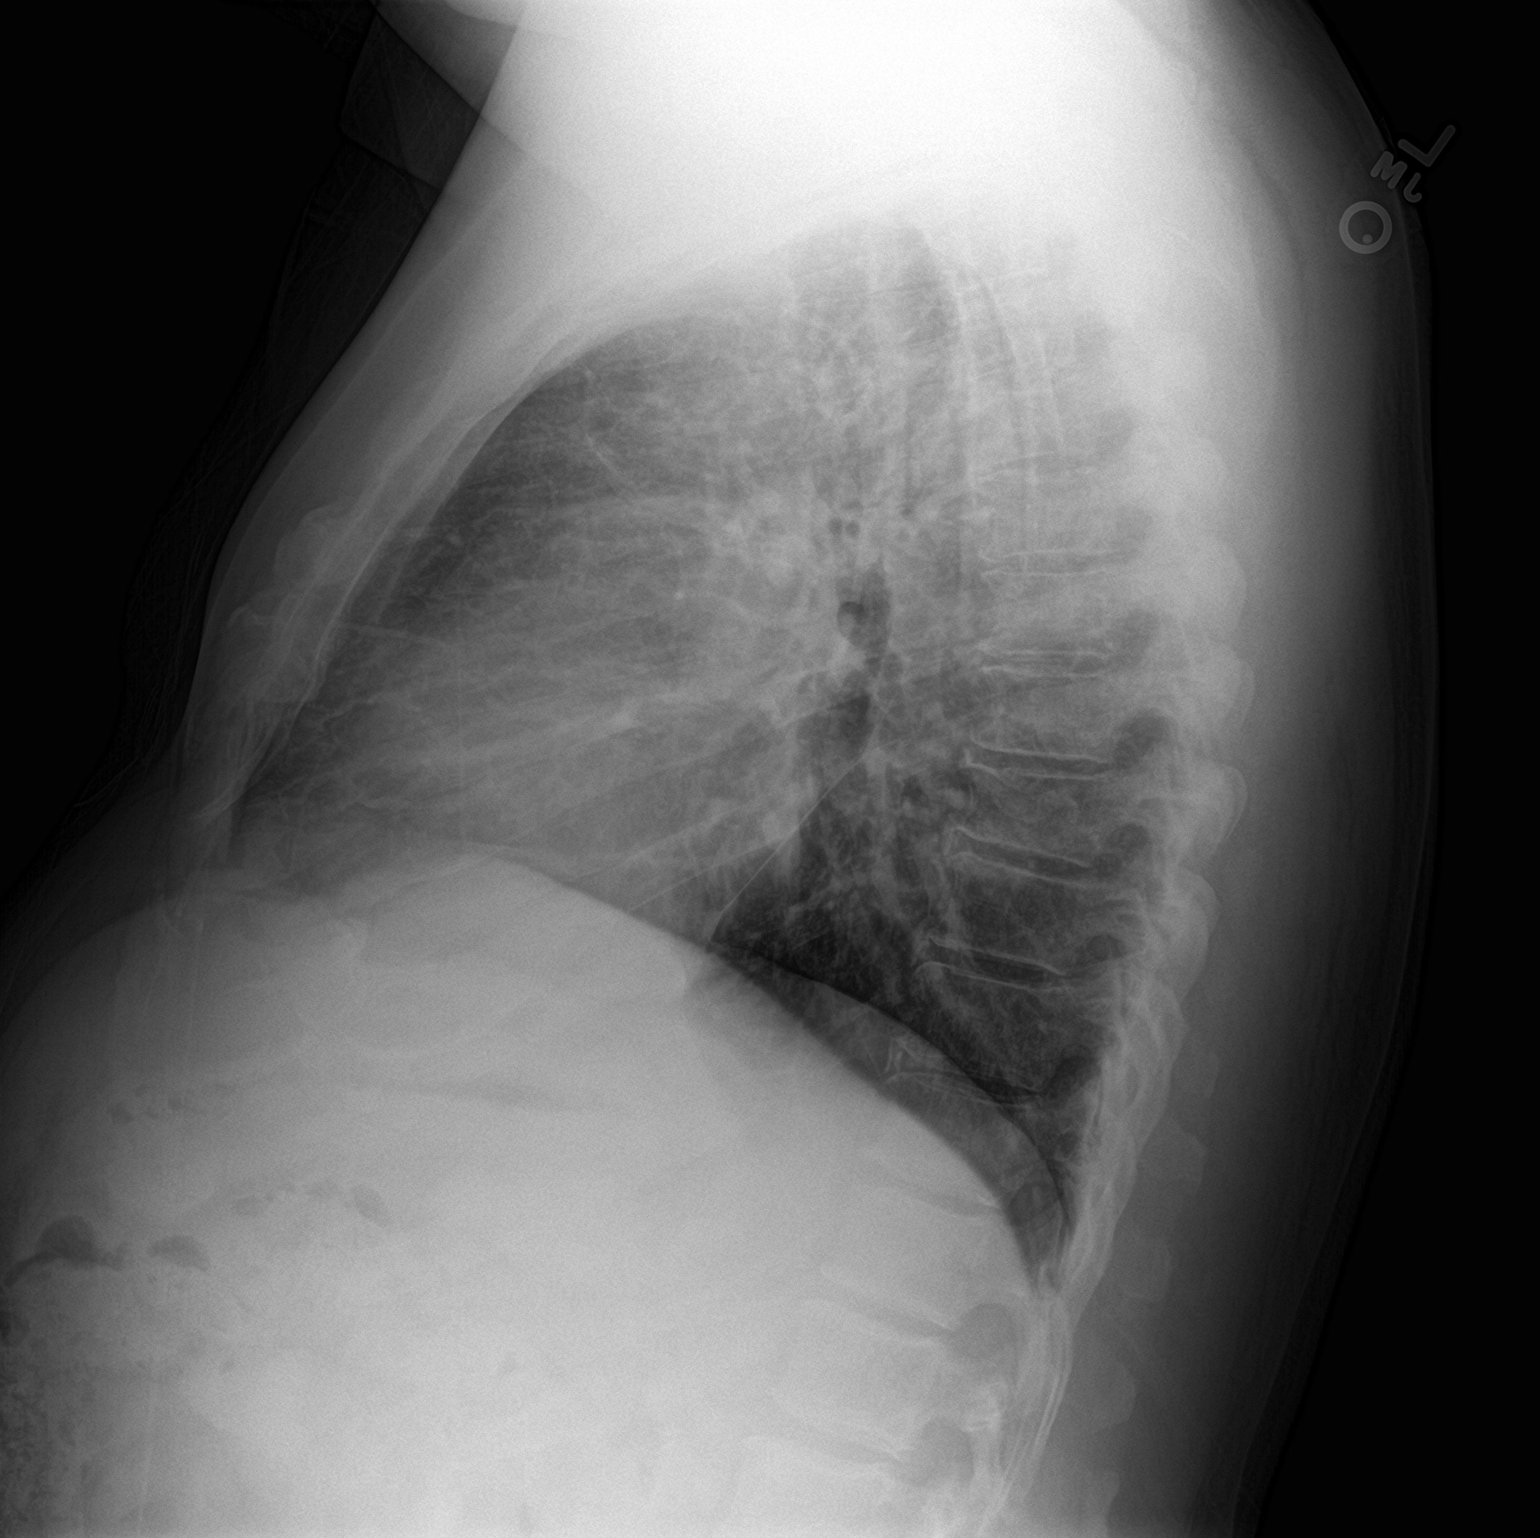

[2 of 2 positions shown; findings below may reference images not displayed]

FINDINGS: The heart size and mediastinal contours are within normal limits.
Both lungs are clear. The visualized skeletal structures are
unremarkable.
IMPRESSION: No active cardiopulmonary disease.

## 2023-08-08 ENCOUNTER — Ambulatory Visit
Admission: EM | Admit: 2023-08-08 | Discharge: 2023-08-08 | Disposition: A | Discharge status: Home / Self Care | Payer: 59 | Attending: Family Medicine | Admitting: Family Medicine

## 2023-08-08 ENCOUNTER — Other Ambulatory Visit: Payer: Self-pay

## 2023-08-08 ENCOUNTER — Ambulatory Visit (INDEPENDENT_AMBULATORY_CARE_PROVIDER_SITE_OTHER): Payer: 59

## 2023-08-08 DIAGNOSIS — M7711 Lateral epicondylitis, right elbow: Secondary | ICD-10-CM

## 2023-08-08 MED ORDER — PREDNISONE 50 MG PO TABS: ORAL_TABLET | ORAL | 0 refills | Status: DC

## 2023-08-08 NOTE — ED Provider Notes (Signed)
[facility]    CSN: [id number] Arrival date & time: 08/08/23  1505      History   Chief Complaint Chief Complaint  Patient presents with   Arm Injury    HPI Tyler Warren is a 52 y.o. male.   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  He does not think he has had any change in his activity.  Denies accident or injury.  He is here because he has pain in his right elbow.  It has been coming on for the last couple of weeks.  It is getting worse over time.  It is painful with movement and with use.  It is better at rest.  He is been taking ibuprofen for the pain.  He states that his stomach is upset from all the ibuprofen.  States he sometimes gets pain down into his thumb.    Past Medical History:  Diagnosis Date   Alcohol abuse    Alcoholic pancreatitis    Head trauma    Hypertension     Patient Active Problem List   Diagnosis Date Noted   Acute bronchitis 09/26/2015   Lobar pneumonia (HCC) 05/23/2015   Other acute pancreatitis    Alcohol abuse 05/21/2015   SOB (shortness of breath) 05/21/2015   Hyperlipidemia 04/29/2015   Bilateral low back pain without sciatica 12/16/2014   Recurrent acute pancreatitis 07/05/2014   Essential hypertension, benign 06/17/2014   History of traumatic brain injury 06/17/2014   Numbness 06/17/2014    Past Surgical History:  Procedure Laterality Date   TRACHEOSTOMY         Home Medications    Prior to Admission medications   Medication Sig Start Date End Date Taking? Authorizing Provider  predniSONE (DELTASONE) 50 MG tablet Take once a day for 5 days.  Take with food 08/08/23  Yes Eustace Moore, MD  ibuprofen (ADVIL,MOTRIN) 800 MG tablet TAKE 1 TABLET (800 MG TOTAL) BY MOUTH EVERY 8 (EIGHT) HOURS AS NEEDED FOR MILD PAIN. 03/29/16   Laren Boom, DO  traMADol (ULTRAM) 50 MG tablet Take 1 tablet (50 mg total) by mouth every 8 (eight) hours as needed (pain or cough). 09/26/15   Rodolph Bong, MD    Family History Family History  Problem Relation Age of Onset   Healthy Mother    Healthy Father     Social History Social History   Tobacco Use   Smoking status: Never   Smokeless tobacco: Never  Vaping Use   Vaping status: Never Used  Substance Use Topics   Alcohol use: Yes    Alcohol/week: 7.0 standard drinks of alcohol    Types: 7 Standard drinks or equivalent per week   Drug use: Yes    Types: Marijuana    Comment: occ     Allergies   Hydrocodone   Review of Systems Review of Systems   Physical Exam Triage Vital Signs ED Triage Vitals  Encounter Vitals Group     BP 08/08/23 1513 126/80     Systolic BP Percentile --      Diastolic BP Percentile --      Pulse Rate 08/08/23 1513 65     Resp 08/08/23 1513 16     Temp 08/08/23 1513 97.9 F (36.6 C)     Temp Source 08/08/23 1513 Oral     SpO2 08/08/23 1513 99 %     Weight --  Height --      Head Circumference --      Peak Flow --      Pain Score 08/08/23 1515 3     Pain Loc --      Pain Education --      Exclude from Growth Chart --    No data found.  Updated Vital Signs BP 126/80   Pulse 65   Temp 97.9 F (36.6 C) (Oral)   Resp 16   SpO2 99%      Physical Exam Constitutional:      General: He is not in acute distress.    Appearance: He is well-developed.  HENT:     Head: Normocephalic and atraumatic.  Eyes:     Conjunctiva/sclera: Conjunctivae normal.     Pupils: Pupils are equal, round, and reactive to light.  Cardiovascular:     Rate and Rhythm: Normal rate.  Pulmonary:     Effort: Pulmonary effort is normal. No respiratory distress.  Abdominal:     General: There is no distension.     Palpations: Abdomen is soft.  Musculoskeletal:        General: Tenderness present. Normal range of motion.     Cervical back: Normal range of motion.     Comments: Patient has tenderness over lateral epicondyles, and has pain with any resistance to wrist extension, or finger  extension consistent with lateral epicondylitis.  Patient lacks full extension of elbow by just a couple degrees.  Normal flexion.  Normal pronation and supination.  Skin:    General: Skin is warm and dry.  Neurological:     Mental Status: He is alert.      UC Treatments / Results  Labs (all labs ordered are listed, but only abnormal results are displayed) Labs Reviewed - No data to display  EKG   Radiology No results found.  Procedures Procedures (including critical care time)  Medications Ordered in UC Medications - No data to display  Initial Impression / Assessment and Plan / UC Course  I have reviewed the triage vital signs and the nursing notes.  Pertinent labs & imaging results that were available during my care of the patient were reviewed by me and considered in my medical decision making (see chart for details).     Discussed the etiology of lateral epicondylitis, repetitive motion.  Patient desires x-ray because of remote history of trauma to right arm. Final Clinical Impressions(s) / UC Diagnoses   Final diagnoses:  Lateral epicondylitis of right elbow     Discharge Instructions      Put ice on this area for 20 minutes every couple of hours Try to reduce heavy use of this arm Take the prednisone as directed Wear the elbow strap to reduce stress on the elbow See a sports medicine doctor if you fail to improve.  An injection may be needed     ED Prescriptions     Medication Sig Dispense Auth. Provider   predniSONE (DELTASONE) 50 MG tablet Take once a day for 5 days.  Take with food 5 tablet Eustace Moore, MD      PDMP not reviewed this encounter.   Eustace Moore, MD 08/08/23 [phone]

## 2023-08-08 NOTE — Discharge Instructions (Signed)
Put ice on this area for 20 minutes every couple of hours Try to reduce heavy use of this arm Take the prednisone as directed Wear the elbow strap to reduce stress on the elbow See a sports medicine doctor if you fail to improve.  An injection may be needed

## 2023-08-08 NOTE — ED Triage Notes (Signed)
Right elbow pain x 4 weeks. Denies any injury. Has been taking pain pills without relief.

## 2023-10-08 ENCOUNTER — Ambulatory Visit
Admission: RE | Admit: 2023-10-08 | Discharge: 2023-10-08 | Disposition: A | Discharge status: Home / Self Care | Payer: 59 | Source: Ambulatory Visit | Attending: Family Medicine | Admitting: Family Medicine

## 2023-10-08 VITALS — BP 123/78 | HR 63 | Temp 97.6°F | Resp 18 | Ht 69.0 in | Wt 200.0 lb

## 2023-10-08 DIAGNOSIS — R062 Wheezing: Secondary | ICD-10-CM | POA: Diagnosis not present

## 2023-10-08 DIAGNOSIS — Z20828 Contact with and (suspected) exposure to other viral communicable diseases: Secondary | ICD-10-CM | POA: Diagnosis not present

## 2023-10-08 DIAGNOSIS — J209 Acute bronchitis, unspecified: Secondary | ICD-10-CM

## 2023-10-08 MED ORDER — ALBUTEROL SULFATE HFA 108 (90 BASE) MCG/ACT IN AERS
2.0000 | INHALATION_SPRAY | Freq: Once | RESPIRATORY_TRACT | Status: AC
  Administered 2023-10-08: 2 via RESPIRATORY_TRACT

## 2023-10-08 MED ORDER — DOXYCYCLINE HYCLATE 100 MG PO CAPS: 100.0000 mg | ORAL_CAPSULE | Freq: Two times a day (BID) | ORAL | 0 refills | Status: AC

## 2023-10-08 MED ORDER — PREDNISONE 50 MG PO TABS: ORAL_TABLET | ORAL | 0 refills | Status: AC

## 2023-10-08 NOTE — ED Provider Notes (Signed)
[facility]    CSN: [id number] Arrival date & time: 10/08/23  0850      History   Chief Complaint Chief Complaint  Patient presents with   Cough    Cough sounds horrible sinus can breath good. - Entered by patient    HPI Tyler Warren is a 52 y.o. male.   Patient is here with his wife.  She made him an appointment to be seen for a terrible cough.  She states that he has bad coughing spells, he complains that his chest hurts when he coughs, feels like he cannot take a deep breath.  Has been exposed to a grandchild with RSV.  He is not a smoker, no underlying asthma, but he does feel like he is wheezing.  He is feeling tired.  Cough has been lasting for over a week.    Past Medical History:  Diagnosis Date   Alcohol abuse    Alcoholic pancreatitis    Head trauma    Hypertension     Patient Active Problem List   Diagnosis Date Noted   Acute bronchitis 09/26/2015   Lobar pneumonia (HCC) 05/23/2015   Other acute pancreatitis    Alcohol abuse 05/21/2015   SOB (shortness of breath) 05/21/2015   Hyperlipidemia 04/29/2015   Bilateral low back pain without sciatica 12/16/2014   Recurrent acute pancreatitis 07/05/2014   Essential hypertension, benign 06/17/2014   History of traumatic brain injury 06/17/2014   Numbness 06/17/2014    Past Surgical History:  Procedure Laterality Date   TRACHEOSTOMY         Home Medications    Prior to Admission medications   Medication Sig Start Date End Date Taking? Authorizing Provider  doxycycline (VIBRAMYCIN) 100 MG capsule Take 1 capsule (100 mg total) by mouth 2 (two) times daily. 10/08/23  Yes Eustace Moore, MD  lisinopril (ZESTRIL) 40 MG tablet Take 40 mg by mouth daily.   Yes [provider]  predniSONE (DELTASONE) 50 MG tablet Take once a day for 5 days.  Take with food 10/08/23  Yes Eustace Moore, MD  ibuprofen (ADVIL,MOTRIN) 800 MG tablet TAKE 1 TABLET (800 MG TOTAL) BY MOUTH EVERY 8 (EIGHT)  HOURS AS NEEDED FOR MILD PAIN. 03/29/16   Laren Boom, DO    Family History Family History  Problem Relation Age of Onset   Healthy Mother    Healthy Father     Social History Social History   Tobacco Use   Smoking status: Never   Smokeless tobacco: Never  Vaping Use   Vaping status: Never Used  Substance Use Topics   Alcohol use: Yes    Alcohol/week: 7.0 standard drinks of alcohol    Types: 7 Standard drinks or equivalent per week   Drug use: Not Currently    Types: Marijuana    Comment: occ     Allergies   Hydrocodone   Review of Systems Review of Systems See HPI  Physical Exam Triage Vital Signs ED Triage Vitals  Encounter Vitals Group     BP 10/08/23 0917 123/78     Systolic BP Percentile --      Diastolic BP Percentile --      Pulse Rate 10/08/23 0917 63     Resp 10/08/23 0917 18     Temp 10/08/23 0917 97.6 F (36.4 C)     Temp Source 10/08/23 0917 Oral     SpO2 10/08/23 0917 95 %     Weight 10/08/23 0915 200 lb (  90.7 kg)     Height 10/08/23 0915 5\' 9"  (1.753 m)     Head Circumference --      Peak Flow --      Pain Score 10/08/23 0915 0     Pain Loc --      Pain Education --      Exclude from Growth Chart --    No data found.  Updated Vital Signs BP 123/78   Pulse 63   Temp 97.6 F (36.4 C) (Oral)   Resp 18   Ht 5\' 9"  (1.753 m)   Wt 90.7 kg   SpO2 95%   BMI 29.53 kg/m      Physical Exam Constitutional:      General: He is not in acute distress.    Appearance: He is well-developed and normal weight. He is ill-appearing.  HENT:     Head: Normocephalic and atraumatic.     Right Ear: Tympanic membrane and ear canal normal.     Left Ear: Tympanic membrane and ear canal normal.     Nose: Nose normal. No congestion.     Mouth/Throat:     Mouth: Mucous membranes are moist.     Pharynx: No posterior oropharyngeal erythema.  Eyes:     Conjunctiva/sclera: Conjunctivae normal.     Pupils: Pupils are equal, round, and reactive to light.   Cardiovascular:     Rate and Rhythm: Normal rate and regular rhythm.     Heart sounds: Normal heart sounds.  Pulmonary:     Effort: Pulmonary effort is normal. No respiratory distress.     Breath sounds: Wheezing and rhonchi present.     Comments: On initial auscultation lungs are full of wheeze and rhonchi.  After albuterol treatment the wheezes have resolved with only scattered rhonchi remaining Abdominal:     General: There is no distension.     Palpations: Abdomen is soft.  Musculoskeletal:        General: Normal range of motion.     Cervical back: Normal range of motion.  Skin:    General: Skin is warm and dry.  Neurological:     Mental Status: He is alert.      UC Treatments / Results  Labs (all labs ordered are listed, but only abnormal results are displayed) Labs Reviewed - No data to display  EKG   Radiology No results found.  Procedures Procedures (including critical care time)  Medications Ordered in UC Medications  albuterol (VENTOLIN HFA) 108 (90 Base) MCG/ACT inhaler 2 puff (2 puffs Inhalation Given 10/08/23 0934)    Initial Impression / Assessment and Plan / UC Course  I have reviewed the triage vital signs and the nursing notes.  Pertinent labs & imaging results that were available during my care of the patient were reviewed by me and considered in my medical decision making (see chart for details).     Final Clinical Impressions(s) / UC Diagnoses   Final diagnoses:  Acute bronchitis, unspecified organism  Wheezing  Exposure to respiratory syncytial virus (RSV)     Discharge Instructions      Take the doxycycline 2 times a day.  It is important to take this medication with food Take prednisone daily for 5 days Make sure you are drinking lots of water May take over-the-counter cough or cold medicines if needed Use inhaler as needed for wheezing, shortness of breath, or coughing spells See Dr. If not improving in a week   ED  Prescriptions  Medication Sig Dispense Auth. Provider   predniSONE (DELTASONE) 50 MG tablet Take once a day for 5 days.  Take with food 5 tablet Eustace Moore, MD   doxycycline (VIBRAMYCIN) 100 MG capsule Take 1 capsule (100 mg total) by mouth 2 (two) times daily. 20 capsule Eustace Moore, MD      PDMP not reviewed this encounter.   Eustace Moore, MD 10/08/23 [phone]

## 2023-10-08 NOTE — Discharge Instructions (Signed)
Take the doxycycline 2 times a day.  It is important to take this medication with food Take prednisone daily for 5 days Make sure you are drinking lots of water May take over-the-counter cough or cold medicines if needed Use inhaler as needed for wheezing, shortness of breath, or coughing spells See Dr. If not improving in a week

## 2023-10-08 NOTE — ED Triage Notes (Signed)
Pt states that he has a cough and nasal congestion x1 week

## 2024-04-26 ENCOUNTER — Encounter (HOSPITAL_BASED_OUTPATIENT_CLINIC_OR_DEPARTMENT_OTHER): Payer: Self-pay | Admitting: Student

## 2024-04-26 ENCOUNTER — Ambulatory Visit (HOSPITAL_BASED_OUTPATIENT_CLINIC_OR_DEPARTMENT_OTHER)

## 2024-04-26 ENCOUNTER — Other Ambulatory Visit (HOSPITAL_BASED_OUTPATIENT_CLINIC_OR_DEPARTMENT_OTHER): Payer: Self-pay

## 2024-04-26 ENCOUNTER — Ambulatory Visit (HOSPITAL_BASED_OUTPATIENT_CLINIC_OR_DEPARTMENT_OTHER): Admitting: Student

## 2024-04-26 DIAGNOSIS — M25572 Pain in left ankle and joints of left foot: Secondary | ICD-10-CM

## 2024-04-26 DIAGNOSIS — S93492A Sprain of other ligament of left ankle, initial encounter: Secondary | ICD-10-CM

## 2024-04-26 MED ORDER — MELOXICAM 15 MG PO TABS
15.0000 mg | ORAL_TABLET | Freq: Every day | ORAL | 0 refills | Status: AC
  Filled 2024-04-26: qty 10, 10d supply, fill #0
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
## 2024-08-20 ENCOUNTER — Encounter: Payer: Self-pay | Admitting: Radiology
